# Patient Record
Sex: Female | Born: 1955 | Race: White | Hispanic: No | Marital: Married | Smoking: Former smoker
Health system: Southern US, Community
[De-identification: ages and names within clinical notes are randomized; demographics above are authoritative.]

## PROBLEM LIST (undated history)

## (undated) DIAGNOSIS — R7303 Prediabetes: Secondary | ICD-10-CM

## (undated) DIAGNOSIS — I10 Essential (primary) hypertension: Secondary | ICD-10-CM

## (undated) DIAGNOSIS — T7840XA Allergy, unspecified, initial encounter: Secondary | ICD-10-CM

## (undated) DIAGNOSIS — E669 Obesity, unspecified: Secondary | ICD-10-CM

## (undated) DIAGNOSIS — E785 Hyperlipidemia, unspecified: Secondary | ICD-10-CM

## (undated) DIAGNOSIS — F419 Anxiety disorder, unspecified: Secondary | ICD-10-CM

## (undated) DIAGNOSIS — E119 Type 2 diabetes mellitus without complications: Secondary | ICD-10-CM

## (undated) HISTORY — PX: EYE SURGERY: SHX253

## (undated) HISTORY — DX: Essential (primary) hypertension: I10

## (undated) HISTORY — DX: Hyperlipidemia, unspecified: E78.5

## (undated) HISTORY — DX: Allergy, unspecified, initial encounter: T78.40XA

## (undated) HISTORY — PX: TUBAL LIGATION: SHX77

## (undated) HISTORY — DX: Anxiety disorder, unspecified: F41.9

## (undated) HISTORY — DX: Type 2 diabetes mellitus without complications: E11.9

---
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
## 2015-08-16 LAB — HM HEPATITIS C SCREENING LAB: HM HEPATITIS C SCREENING: NEGATIVE

## 2016-11-16 LAB — HEMOGLOBIN A1C: HEMOGLOBIN A1C: 5.6

## 2016-11-17 LAB — VITAMIN D 25 HYDROXY (VIT D DEFICIENCY, FRACTURES): Vit D, 25-Hydroxy: 45.7

## 2016-11-17 LAB — TSH: TSH: 1.35 (ref 0.41–5.90)

## 2016-11-17 LAB — HEPATIC FUNCTION PANEL
ALT: 14 (ref 7–35)
AST: 15 (ref 13–35)
Alkaline Phosphatase: 37 (ref 25–125)
BILIRUBIN, TOTAL: 0.4

## 2016-11-17 LAB — BASIC METABOLIC PANEL
BUN: 11 (ref 4–21)
Creatinine: 0.7 (ref 0.5–1.1)
GLUCOSE: 95
POTASSIUM: 4 (ref 3.4–5.3)
SODIUM: 141 (ref 137–147)

## 2016-11-17 LAB — LIPID PANEL
Cholesterol: 156 (ref 0–200)
HDL: 45 (ref 35–70)
LDL Cholesterol: 80
Triglycerides: 155 (ref 40–160)

## 2016-11-17 LAB — CBC AND DIFFERENTIAL
HCT: 44 (ref 36–46)
HEMOGLOBIN: 15.1 (ref 12.0–16.0)
Neutrophils Absolute: 4
PLATELETS: 238 (ref 150–399)
WBC: 6.8

## 2016-11-18 LAB — HM PAP SMEAR: HM Pap smear: NEGATIVE

## 2017-05-10 ENCOUNTER — Encounter: Payer: Self-pay | Admitting: Family Medicine

## 2017-05-10 ENCOUNTER — Ambulatory Visit (INDEPENDENT_AMBULATORY_CARE_PROVIDER_SITE_OTHER): Payer: BC Managed Care – PPO | Admitting: Family Medicine

## 2017-05-10 VITALS — BP 114/68 | HR 88 | Temp 97.7°F | Resp 16 | Ht 64.0 in | Wt 184.0 lb

## 2017-05-10 DIAGNOSIS — N3281 Overactive bladder: Secondary | ICD-10-CM

## 2017-05-10 DIAGNOSIS — Z720 Tobacco use: Secondary | ICD-10-CM

## 2017-05-10 DIAGNOSIS — F419 Anxiety disorder, unspecified: Secondary | ICD-10-CM

## 2017-05-10 DIAGNOSIS — K5903 Drug induced constipation: Secondary | ICD-10-CM

## 2017-05-10 DIAGNOSIS — Z87891 Personal history of nicotine dependence: Secondary | ICD-10-CM | POA: Insufficient documentation

## 2017-05-10 DIAGNOSIS — R739 Hyperglycemia, unspecified: Secondary | ICD-10-CM | POA: Diagnosis not present

## 2017-05-10 DIAGNOSIS — E559 Vitamin D deficiency, unspecified: Secondary | ICD-10-CM

## 2017-05-10 DIAGNOSIS — E785 Hyperlipidemia, unspecified: Secondary | ICD-10-CM | POA: Insufficient documentation

## 2017-05-10 DIAGNOSIS — K5900 Constipation, unspecified: Secondary | ICD-10-CM | POA: Insufficient documentation

## 2017-05-10 DIAGNOSIS — Z7689 Persons encountering health services in other specified circumstances: Secondary | ICD-10-CM | POA: Diagnosis not present

## 2017-05-10 DIAGNOSIS — Z1231 Encounter for screening mammogram for malignant neoplasm of breast: Secondary | ICD-10-CM | POA: Diagnosis not present

## 2017-05-10 DIAGNOSIS — K649 Unspecified hemorrhoids: Secondary | ICD-10-CM

## 2017-05-10 DIAGNOSIS — I10 Essential (primary) hypertension: Secondary | ICD-10-CM

## 2017-05-10 DIAGNOSIS — R7303 Prediabetes: Secondary | ICD-10-CM | POA: Insufficient documentation

## 2017-05-10 DIAGNOSIS — E782 Mixed hyperlipidemia: Secondary | ICD-10-CM

## 2017-05-10 MED ORDER — ROSUVASTATIN CALCIUM 10 MG PO TABS: 10.0000 mg | ORAL_TABLET | Freq: Every day | ORAL | 5 refills | Status: DC

## 2017-05-10 MED ORDER — FESOTERODINE FUMARATE ER 8 MG PO TB24: 8.0000 mg | ORAL_TABLET | Freq: Every day | ORAL | 5 refills | Status: DC

## 2017-05-10 MED ORDER — FENOFIBRATE 145 MG PO TABS: 145.0000 mg | ORAL_TABLET | Freq: Every day | ORAL | 5 refills | Status: DC

## 2017-05-10 NOTE — Assessment & Plan Note (Signed)
Well controlled Can continue buspar, but unlikely to be helping much at such small dose Ok to use xanax so infrequently, will not increase dose or frequency in the future

## 2017-05-10 NOTE — Patient Instructions (Signed)
Hemorrhoids Hemorrhoids are swollen veins in and around the rectum or anus. There are two types of hemorrhoids:  Internal hemorrhoids. These occur in the veins that are just inside the rectum. They may poke through to the outside and become irritated and painful.  External hemorrhoids. These occur in the veins that are outside of the anus and can be felt as a painful swelling or hard lump near the anus.  Most hemorrhoids do not cause serious problems, and they can be managed with home treatments such as diet and lifestyle changes. If home treatments do not help your symptoms, procedures can be done to shrink or remove the hemorrhoids. What are the causes? This condition is caused by increased pressure in the anal area. This pressure may result from various things, including:  Constipation.  Straining to have a bowel movement.  Diarrhea.  Pregnancy.  Obesity.  Sitting for long periods of time.  Heavy lifting or other activity that causes you to strain.  Anal sex.  What are the signs or symptoms? Symptoms of this condition include:  Pain.  Anal itching or irritation.  Rectal bleeding.  Leakage of stool (feces).  Anal swelling.  One or more lumps around the anus.  How is this diagnosed? This condition can often be diagnosed through a visual exam. Other exams or tests may also be done, such as:  Examination of the rectal area with a gloved hand (digital rectal exam).  Examination of the anal canal using a small tube (anoscope).  A blood test, if you have lost a significant amount of blood.  A test to look inside the colon (sigmoidoscopy or colonoscopy).  How is this treated? This condition can usually be treated at home. However, various procedures may be done if dietary changes, lifestyle changes, and other home treatments do not help your symptoms. These procedures can help make the hemorrhoids smaller or remove them completely. Some of these procedures involve  surgery, and others do not. Common procedures include:  Rubber band ligation. Rubber bands are placed at the base of the hemorrhoids to cut off the blood supply to them.  Sclerotherapy. Medicine is injected into the hemorrhoids to shrink them.  Infrared coagulation. A type of light energy is used to get rid of the hemorrhoids.  Hemorrhoidectomy surgery. The hemorrhoids are surgically removed, and the veins that supply them are tied off.  Stapled hemorrhoidopexy surgery. A circular stapling device is used to remove the hemorrhoids and use staples to cut off the blood supply to them.  Follow these instructions at home: Eating and drinking  Eat foods that have a lot of fiber in them, such as whole grains, beans, nuts, fruits, and vegetables. Ask your health care provider about taking products that have added fiber (fiber supplements).  Drink enough fluid to keep your urine clear or pale yellow. Managing pain and swelling  Take warm sitz baths for 20 minutes, 3-4 times a day to ease pain and discomfort.  If directed, apply ice to the affected area. Using ice packs between sitz baths may be helpful. ? Put ice in a plastic bag. ? Place a towel between your skin and the bag. ? Leave the ice on for 20 minutes, 2-3 times a day. General instructions  Take over-the-counter and prescription medicines only as told by your health care provider.  Use medicated creams or suppositories as told.  Exercise regularly.  Go to the bathroom when you have the urge to have a bowel movement. Do not wait.    Avoid straining to have bowel movements.  Keep the anal area dry and clean. Use wet toilet paper or moist towelettes after a bowel movement.  Do not sit on the toilet for long periods of time. This increases blood pooling and pain. Contact a health care provider if:  You have increasing pain and swelling that are not controlled by treatment or medicine.  You have uncontrolled bleeding.  You  have difficulty having a bowel movement, or you are unable to have a bowel movement.  You have pain or inflammation outside the area of the hemorrhoids. This information is not intended to replace advice given to you by your health care provider. Make sure you discuss any questions you have with your health care provider. Document Released: 06/12/2000 Document Revised: 11/13/2015 Document Reviewed: 02/27/2015 Elsevier Interactive Patient Education  2017 Elsevier Inc.  

## 2017-05-10 NOTE — Assessment & Plan Note (Signed)
Well controlled Continue current medications Check CMP 

## 2017-05-10 NOTE — Assessment & Plan Note (Signed)
Did not assess today Discussed hydrocortisone cream prn Discussed management of constipation Offered GI referral for possible banding, patient declines at this time

## 2017-05-10 NOTE — Assessment & Plan Note (Signed)
UTD on lipid panel Continue fenofibrates and crestor

## 2017-05-10 NOTE — Assessment & Plan Note (Signed)
Recheck A1c Not on medications

## 2017-05-10 NOTE — Assessment & Plan Note (Signed)
Recheck Vit D 

## 2017-05-10 NOTE — Assessment & Plan Note (Signed)
Discussed management of intermittent constipaiton Trial of daily miralax Continue high fiber diet

## 2017-05-10 NOTE — Assessment & Plan Note (Signed)
Continue toviaz Well controlled
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
## 2017-05-10 NOTE — Assessment & Plan Note (Signed)
3-5 minute discussion regarding risks of smoking, benefits of quitting, ways to quit Patient to try natural remedies and cutting back Continue to assess at f/u visits

## 2017-05-11 ENCOUNTER — Telehealth: Payer: Self-pay

## 2017-05-11 ENCOUNTER — Encounter: Payer: Self-pay | Admitting: Family Medicine

## 2017-05-11 LAB — VITAMIN D 25 HYDROXY (VIT D DEFICIENCY, FRACTURES): VIT D 25 HYDROXY: 43 ng/mL (ref 30–100)

## 2017-05-11 LAB — COMPLETE METABOLIC PANEL WITH GFR
AG Ratio: 1.9 (calc) (ref 1.0–2.5)
ALT: 22 U/L (ref 6–29)
AST: 16 U/L (ref 10–35)
Albumin: 4.5 g/dL (ref 3.6–5.1)
Alkaline phosphatase (APISO): 44 U/L (ref 33–130)
BUN: 17 mg/dL (ref 7–25)
CALCIUM: 10.1 mg/dL (ref 8.6–10.4)
CO2: 31 mmol/L (ref 20–32)
CREATININE: 0.6 mg/dL (ref 0.50–0.99)
Chloride: 106 mmol/L (ref 98–110)
GFR, EST NON AFRICAN AMERICAN: 99 mL/min/{1.73_m2} (ref >=60)
GFR, Est African American: 115 mL/min/{1.73_m2} (ref >=60)
GLOBULIN: 2.4 g/dL (ref 1.9–3.7)
Glucose, Bld: 114 mg/dL (ref 65–139)
Potassium: 4.3 mmol/L (ref 3.5–5.3)
SODIUM: 142 mmol/L (ref 135–146)
Total Bilirubin: 0.3 mg/dL (ref 0.2–1.2)
Total Protein: 6.9 g/dL (ref 6.1–8.1)

## 2017-05-11 LAB — HEMOGLOBIN A1C
HEMOGLOBIN A1C: 5.5 %{Hb} (ref <5.7)
MEAN PLASMA GLUCOSE: 111 (calc)
eAG (mmol/L): 6.2 (calc)

## 2017-05-11 NOTE — Telephone Encounter (Signed)
-----   Message from Erasmo DownerAngela M Bacigalupo, MD sent at 05/11/2017  8:18 AM EST ----- Normal A1c, Vitamin D, kidney function, liver function, electrolytes.  Erasmo DownerBacigalupo, Angela M, MD, MPH [facility] 05/11/2017 8:18 AM

## 2017-05-11 NOTE — Telephone Encounter (Signed)
Left message advising pt. OK per DPR. 

## 2017-05-18 ENCOUNTER — Ambulatory Visit
Admission: RE | Admit: 2017-05-18 | Discharge: 2017-05-18 | Disposition: A | Discharge status: Home / Self Care | Payer: BC Managed Care – PPO | Source: Ambulatory Visit | Attending: Family Medicine | Admitting: Family Medicine

## 2017-05-18 DIAGNOSIS — Z1231 Encounter for screening mammogram for malignant neoplasm of breast: Secondary | ICD-10-CM | POA: Diagnosis present

## 2017-05-24 ENCOUNTER — Inpatient Hospital Stay
Admission: RE | Admit: 2017-05-24 | Discharge: 2017-05-24 | Disposition: A | Discharge status: Home / Self Care | Payer: Self-pay | Source: Ambulatory Visit | Attending: *Deleted | Admitting: *Deleted

## 2017-05-24 ENCOUNTER — Telehealth: Payer: Self-pay

## 2017-05-24 ENCOUNTER — Other Ambulatory Visit: Payer: Self-pay | Admitting: *Deleted

## 2017-05-24 DIAGNOSIS — Z9289 Personal history of other medical treatment: Secondary | ICD-10-CM

## 2017-05-24 NOTE — Telephone Encounter (Signed)
Pt advised and saw the results on My Chart.

## 2017-05-24 NOTE — Telephone Encounter (Signed)
-----   Message from Erasmo DownerAngela M Bacigalupo, MD sent at 05/24/2017  3:25 PM EST ----- Normal mammogram.    Erasmo DownerBacigalupo, Angela M, MD, MPH [facility] 05/24/2017 3:25 PM

## 2017-07-07 ENCOUNTER — Other Ambulatory Visit: Payer: Self-pay | Admitting: Family Medicine

## 2017-07-07 ENCOUNTER — Telehealth: Payer: Self-pay | Admitting: Family Medicine

## 2017-07-07 DIAGNOSIS — Z111 Encounter for screening for respiratory tuberculosis: Secondary | ICD-10-CM

## 2017-07-07 NOTE — Telephone Encounter (Signed)
OK to order quantiferon gold for TB screening  Bonnie Duffy, Bonnie SchleinAngela M, MD, MPH [facility] 07/07/2017 2:30 PM

## 2017-07-07 NOTE — Telephone Encounter (Signed)
Patient states that she is starting a new job and is needing a form filled out and it requires her to have a TB screen.  I advised her to bring the form and that we only do blood work for to screen for TB.  She states that she does not think she needs a OV for this since she was just seen in November.  Please let patient know when lab slip is ready.

## 2017-07-07 NOTE — Telephone Encounter (Signed)
Ok to order lab

## 2017-07-07 NOTE — Telephone Encounter (Signed)
Lab slip printed at front desk for pick up. Patient advised.  

## 2017-07-11 LAB — QUANTIFERON-TB GOLD PLUS
QUANTIFERON NIL VALUE: 0.03 [IU]/mL
QUANTIFERON TB2 AG VALUE: 0.03 [IU]/mL
QUANTIFERON-TB GOLD PLUS: NEGATIVE
QuantiFERON Mitogen Value: >10 IU/mL
QuantiFERON TB1 Ag Value: 0.02 IU/mL

## 2017-07-12 ENCOUNTER — Telehealth: Payer: Self-pay

## 2017-07-12 DIAGNOSIS — Z0184 Encounter for antibody response examination: Secondary | ICD-10-CM

## 2017-07-12 NOTE — Telephone Encounter (Signed)
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 Forms asking for immunizations to UTD. Per our records and [facility], Hep B and Tdap UTD, but no records of MMR. Pt agrees to have titers drawn. Labs ordered and pt advised.

## 2017-07-13 ENCOUNTER — Telehealth: Payer: Self-pay

## 2017-07-13 LAB — MEASLES/MUMPS/RUBELLA IMMUNITY
MUMPS ABS, IGG: 222 [AU]/ml (ref >10.9)
RUBEOLA AB, IGG: 220 AU/mL (ref >29.9)
Rubella Antibodies, IGG: 1.56 index (ref >0.99)

## 2017-07-13 NOTE — Telephone Encounter (Signed)
-----  Message from Virginia Crews, MD sent at 07/13/2017  8:39 AM EST ----- Patient is MMR immune.  Form completed. Patient can pick it up.  Virginia Crews, MD, MPH [facility] 07/13/2017 8:39 AM

## 2017-07-13 NOTE — Telephone Encounter (Signed)
Pt advised and form placed up front for pick up.

## 2017-11-18 ENCOUNTER — Encounter: Payer: Self-pay | Admitting: Family Medicine

## 2017-11-18 ENCOUNTER — Ambulatory Visit (INDEPENDENT_AMBULATORY_CARE_PROVIDER_SITE_OTHER): Payer: BC Managed Care – PPO | Admitting: Family Medicine

## 2017-11-18 VITALS — BP 130/78 | HR 76 | Temp 97.7°F | Resp 16 | Ht 64.0 in | Wt 195.0 lb

## 2017-11-18 DIAGNOSIS — E782 Mixed hyperlipidemia: Secondary | ICD-10-CM

## 2017-11-18 DIAGNOSIS — Z0000 Encounter for general adult medical examination without abnormal findings: Secondary | ICD-10-CM | POA: Diagnosis not present

## 2017-11-18 DIAGNOSIS — R739 Hyperglycemia, unspecified: Secondary | ICD-10-CM

## 2017-11-18 DIAGNOSIS — I10 Essential (primary) hypertension: Secondary | ICD-10-CM

## 2017-11-18 DIAGNOSIS — R590 Localized enlarged lymph nodes: Secondary | ICD-10-CM

## 2017-11-18 DIAGNOSIS — J0100 Acute maxillary sinusitis, unspecified: Secondary | ICD-10-CM | POA: Diagnosis not present

## 2017-11-18 DIAGNOSIS — Z720 Tobacco use: Secondary | ICD-10-CM

## 2017-11-18 DIAGNOSIS — Z1211 Encounter for screening for malignant neoplasm of colon: Secondary | ICD-10-CM | POA: Diagnosis not present

## 2017-11-18 MED ORDER — NICOTINE POLACRILEX 2 MG MT GUM: 2.0000 mg | CHEWING_GUM | OROMUCOSAL | 5 refills | Status: DC | PRN

## 2017-11-18 MED ORDER — DOXYCYCLINE HYCLATE 100 MG PO TABS: 100.0000 mg | ORAL_TABLET | Freq: Two times a day (BID) | ORAL | 0 refills | Status: DC

## 2017-11-18 MED ORDER — FLUTICASONE PROPIONATE 50 MCG/ACT NA SUSP: 2.0000 | Freq: Every day | NASAL | 11 refills | Status: DC

## 2017-11-18 NOTE — Assessment & Plan Note (Signed)
3 to 5-minute discussion regarding the risks of smoking, including increased cancer risk, heart disease risk, lung disease risk Also discussed the benefits of quitting ways of quitting Patient has been unsuccessful with quitting cold [place] She has been trying a Nicotrol inhaler, but found this is difficult as she smokes menthol cigarettes increase in segmented taste She has quit in the past cold [place] when she was pregnant  She was unable to tolerate Chantix due to nightmares She would like to try nicotine gum today Prescription sent to the pharmacy Discussed importance of chewing in the parking in the cheek for absorption of nicotine

## 2017-11-18 NOTE — Assessment & Plan Note (Signed)
Recheck A1c Not on medications 

## 2017-11-18 NOTE — Assessment & Plan Note (Signed)
Well controlled Continue current meds Check CMP F/u in 6 months 

## 2017-11-18 NOTE — Patient Instructions (Addendum)
The CDC recommends two doses of Shingrix (the shingles vaccine) separated by 2 to 6 months for adults age 62 years and older. I recommend checking with your insurance plan regarding coverage for this vaccine.    Preventive Care 40-64 Years, Female Preventive care refers to lifestyle choices and visits with your health care provider that can promote health and wellness. What does preventive care include?  A yearly physical exam. This is also called an annual well check.  Dental exams once or twice a year.  Routine eye exams. Ask your health care provider how often you should have your eyes checked.  Personal lifestyle choices, including: ? Daily care of your teeth and gums. ? Regular physical activity. ? Eating a healthy diet. ? Avoiding tobacco and drug use. ? Limiting alcohol use. ? Practicing safe sex. ? Taking low-dose aspirin daily starting at age 42. ? Taking vitamin and mineral supplements as recommended by your health care provider. What happens during an annual well check? The services and screenings done by your health care provider during your annual well check will depend on your age, overall health, lifestyle risk factors, and family history of disease. Counseling Your health care provider may ask you questions about your:  Alcohol use.  Tobacco use.  Drug use.  Emotional well-being.  Home and relationship well-being.  Sexual activity.  Eating habits.  Work and work Statistician.  Method of birth control.  Menstrual cycle.  Pregnancy history.  Screening You may have the following tests or measurements:  Height, weight, and BMI.  Blood pressure.  Lipid and cholesterol levels. These may be checked every 5 years, or more frequently if you are over 53 years old.  Skin check.  Lung cancer screening. You may have this screening every year starting at age 3 if you have a 30-pack-year history of smoking and currently smoke or have quit within the past  15 years.  Fecal occult blood test (FOBT) of the stool. You may have this test every year starting at age 24.  Flexible sigmoidoscopy or colonoscopy. You may have a sigmoidoscopy every 5 years or a colonoscopy every 10 years starting at age 76.  Hepatitis C blood test.  Hepatitis B blood test.  Sexually transmitted disease (STD) testing.  Diabetes screening. This is done by checking your blood sugar (glucose) after you have not eaten for a while (fasting). You may have this done every 1-3 years.  Mammogram. This may be done every 1-2 years. Talk to your health care provider about when you should start having regular mammograms. This may depend on whether you have a family history of breast cancer.  BRCA-related cancer screening. This may be done if you have a family history of breast, ovarian, tubal, or peritoneal cancers.  Pelvic exam and Pap test. This may be done every 3 years starting at age 16. Starting at age 9, this may be done every 5 years if you have a Pap test in combination with an HPV test.  Bone density scan. This is done to screen for osteoporosis. You may have this scan if you are at high risk for osteoporosis.  Discuss your test results, treatment options, and if necessary, the need for more tests with your health care provider. Vaccines Your health care provider may recommend certain vaccines, such as:  Influenza vaccine. This is recommended every year.  Tetanus, diphtheria, and acellular pertussis (Tdap, Td) vaccine. You may need a Td booster every 10 years.  Varicella vaccine. You may  need this if you have not been vaccinated.  Zoster vaccine. You may need this after age 3.  Measles, mumps, and rubella (MMR) vaccine. You may need at least one dose of MMR if you were born in 1957 or later. You may also need a second dose.  Pneumococcal 13-valent conjugate (PCV13) vaccine. You may need this if you have certain conditions and were not previously  vaccinated.  Pneumococcal polysaccharide (PPSV23) vaccine. You may need one or two doses if you smoke cigarettes or if you have certain conditions.  Meningococcal vaccine. You may need this if you have certain conditions.  Hepatitis A vaccine. You may need this if you have certain conditions or if you travel or work in places where you may be exposed to hepatitis A.  Hepatitis B vaccine. You may need this if you have certain conditions or if you travel or work in places where you may be exposed to hepatitis B.  Haemophilus influenzae type b (Hib) vaccine. You may need this if you have certain conditions.  Talk to your health care provider about which screenings and vaccines you need and how often you need them. This information is not intended to replace advice given to you by your health care provider. Make sure you discuss any questions you have with your health care provider. Document Released: 07/12/2015 Document Revised: 03/04/2016 Document Reviewed: 04/16/2015 Elsevier Interactive Patient Education  [employer].

## 2017-11-18 NOTE — Assessment & Plan Note (Signed)
Recheck fasting lipid panel today Continue fenofibrate and Crestor

## 2017-11-18 NOTE — Progress Notes (Signed)
Patient: Bonnie Duffy, Female    DOB: 1956/05/04, 62 y.o.   MRN: [id number] Visit Date: 11/18/2017  Today's Provider: Shirlee Latch, MD   I, Joslyn Hy, CMA, am acting as scribe for Shirlee Latch, MD.  Chief Complaint  Patient presents with  . Annual Exam   Subjective:    Annual physical exam Bonnie Duffy is a 62 y.o. female who presents today for health maintenance and complete physical. She feels fairly well.   She is c/o left ear pain, dizziness, sinus swelling, sinus tenderness, jaw/tooth pain that has been occurring for "a couple days". Pt states sx occurred after a "tongue biopsy" that was performed 1 week ago. The biopsy showed lichen planus.  Oral surgeon told her this was not related.   She reports exercising 5 days per week for 45-60 minutes. Exercise classes and cardio. She reports she is sleeping fairly well.  Last colonoscopy- 11/26/2006- Care Everywhere. Normal. Pt is requesting a Cologuard. There is no family H/O colon cancer, and no previous polyps. Last pap- 11/18/2016- NIL; HPV negative. Pt states she has never had an abnormal pap. Last mammogram- 05/18/2017- BI-RADS 1 -----------------------------------------------------------------   Review of Systems  Constitutional: Negative.   HENT: Positive for ear pain, facial swelling, sinus pressure and tinnitus. Negative for congestion, dental problem, drooling, ear discharge, hearing loss, mouth sores, nosebleeds, postnasal drip, rhinorrhea, sinus pain, sneezing, sore throat, trouble swallowing and voice change.   Eyes: Negative.   Respiratory: Negative.   Cardiovascular: Negative.   Gastrointestinal: Negative.   Endocrine: Negative.   Genitourinary: Positive for enuresis. Negative for decreased urine volume, difficulty urinating, dyspareunia, dysuria, flank pain, frequency, genital sores, hematuria, menstrual problem, pelvic pain, urgency, vaginal bleeding, vaginal discharge and vaginal pain.    Musculoskeletal: Negative.   Skin: Negative.   Allergic/Immunologic: Positive for environmental allergies. Negative for food allergies and immunocompromised state.  Neurological: Positive for dizziness. Negative for tremors, seizures, syncope, facial asymmetry, speech difficulty, weakness, light-headedness, numbness and headaches.  Hematological: Negative.   Psychiatric/Behavioral: Negative.     Social History      She  reports that she has been smoking cigarettes.  She has a 46.00 pack-year smoking history. She has never used smokeless tobacco. She reports that she drinks alcohol. She reports that she does not use drugs.       Social History   Socioeconomic History  . Marital status: Divorced    Spouse name: Not on file  . Number of children: 2  . Years of education: Not on file  . Highest education level: Master's degree (e.g., MA, MS, MEng, MEd, MSW, MBA)  Occupational History    Employer: [employer]    Comment: retired  Engineer, production  . Financial resource strain: Not hard at all  . Food insecurity:    Worry: Never true    Inability: Never true  . Transportation needs:    Medical: No    Non-medical: No  Tobacco Use  . Smoking status: Current Every Day Smoker    Packs/day: 1.00    Years: 46.00    Pack years: 46.00    Types: Cigarettes  . Smokeless tobacco: Never Used  . Tobacco comment: started smoking at age 7; has quit smoking for more than 10 years altogther in the past  Substance and Sexual Activity  . Alcohol use: Yes    Comment: one drink per month; wine  . Drug use: No  . Sexual activity: Not Currently  Lifestyle  . Physical  activity:    Days per week: 5 days    Minutes per session: 50 min  . Stress: Not at all  Relationships  . Social connections:    Talks on phone: Not on file    Gets together: Not on file    Attends religious service: Not on file    Active member of club or organization: Not on file    Attends meetings of clubs or organizations: Not on  file    Relationship status: Not on file  Other Topics Concern  . Not on file  Social History Narrative  . Not on file    Past Medical History:  Diagnosis Date  . Allergy   . Anxiety   . Hyperlipidemia   . Hypertension   . T2DM (type 2 diabetes mellitus) [facility])      Patient Active Problem List   Diagnosis Date Noted  . Constipation 05/10/2017  . Hemorrhoids 05/10/2017  . Overactive bladder 05/10/2017  . Anxiety 05/10/2017  . Hyperlipidemia 05/10/2017  . Blood glucose elevated 05/10/2017  . Tobacco abuse 05/10/2017  . Avitaminosis D 05/10/2017  . Essential hypertension 05/10/2017    Past Surgical History:  Procedure Laterality Date  . EYE SURGERY    . TONSILLECTOMY  1960  . TUBAL LIGATION      Family History        Family Status  Relation Name Status  . Mother  Deceased at age 40  . Father  Deceased  . Sister  Alive  . Brother  Alive  . MGM  (Not Specified)  . MGF  (Not Specified)  . PGM  (Not Specified)  . PGF  (Not Specified)  . Neg Hx  (Not Specified)        Her family history includes Breast cancer (age of onset: 37) in her maternal grandmother; Diabetes in her maternal grandmother; Healthy in her brother; Heart attack in her father; Heart disease in her father, maternal grandfather, maternal grandmother, mother, and paternal grandmother; Leukemia in her paternal grandfather; Lung cancer in her father; Prostate cancer in her father; Skin cancer in her father; Stroke (age of onset: 52) in her sister. There is no history of Colon cancer, Ovarian cancer, or Cervical cancer.      Allergies  Allergen Reactions  . Penicillins     No reactions noted.   . Sulfamethoxazole Rash     Current Outpatient Medications:  .  ALPRAZolam (XANAX) 0.25 MG tablet, 1/2 TABLET 2 TO 3 TIMES daily  AS NEEDED, Disp: , Rfl:  .  busPIRone (BUSPAR) 15 MG tablet, Take 0.5 tablets daily by mouth., Disp: , Rfl:  .  fenofibrate (TRICOR) 145 MG tablet, Take 1 tablet (145 mg total)  daily by mouth., Disp: 30 tablet, Rfl: 5 .  fesoterodine (TOVIAZ) 8 MG TB24 tablet, Take 1 tablet (8 mg total) daily by mouth., Disp: 30 tablet, Rfl: 5 .  fexofenadine (ALLEGRA) 180 MG tablet, Take by mouth., Disp: , Rfl:  .  fluticasone (FLONASE) 50 MCG/ACT nasal spray, Place 2 sprays into both nostrils daily., Disp: 16 g, Rfl: 11 .  lisinopril-hydrochlorothiazide (PRINZIDE,ZESTORETIC) 20-25 MG tablet, TAKE ONE half TABLET BY MOUTH EVERY MORNING, Disp: , Rfl:  .  metoprolol succinate (TOPROL-XL) 50 MG 24 hr tablet, TAKE 1/2 TABLET BY MOUTH EVERY DAY, Disp: , Rfl:  .  Multiple Vitamins-Minerals (MULTIVITAMIN PO), Take by mouth., Disp: , Rfl:  .  Omega-3 Fatty Acids (FISH OIL) 1200 MG CAPS, Take by mouth., Disp: , Rfl:  .  rosuvastatin (CRESTOR) 10 MG tablet, Take 1 tablet (10 mg total) daily by mouth., Disp: 30 tablet, Rfl: 5 .  vitamin C (ASCORBIC ACID) 500 MG tablet, Take 500 mg daily by mouth., Disp: , Rfl:  .  Vitamin D, Ergocalciferol, 2000 units CAPS, Take by mouth., Disp: , Rfl:  .  doxycycline (VIBRA-TABS) 100 MG tablet, Take 1 tablet (100 mg total) by mouth 2 (two) times daily., Disp: 20 tablet, Rfl: 0 .  nicotine polacrilex (NICORETTE) 2 MG gum, Take 1 each (2 mg total) by mouth as needed for smoking cessation., Disp: 100 tablet, Rfl: 5   Patient Care Team: Erasmo Downer, MD as PCP - General (Family Medicine)      Objective:   Vitals: BP 130/78 (BP Location: Left Arm, Patient Position: Sitting, Cuff Size: Large)   Pulse 76   Temp 97.7 F (36.5 C) (Oral)   Resp 16   Ht  (1.626 m)   Wt 195 lb (88.5 kg)   SpO2 95%   BMI 33.47 kg/m    Vitals:   11/18/17 0859  BP: 130/78  Pulse: 76  Resp: 16  Temp: 97.7 F (36.5 C)  TempSrc: Oral  SpO2: 95%  Weight: 195 lb (88.5 kg)  Height:  (1.626 m)     Physical Exam  Constitutional: She is oriented to person, place, and time. She appears well-developed and well-nourished. No distress.  HENT:  Head:  Normocephalic and atraumatic.  Right Ear: Tympanic membrane, external ear and ear canal normal.  Left Ear: Tympanic membrane, external ear and ear canal normal.  Nose: Mucosal edema present. Right sinus exhibits no maxillary sinus tenderness and no frontal sinus tenderness. Left sinus exhibits maxillary sinus tenderness and frontal sinus tenderness.  Mouth/Throat: Oropharynx is clear and moist and mucous membranes are normal. No trismus in the jaw. No dental abscesses. No oropharyngeal exudate, posterior oropharyngeal edema or posterior oropharyngeal erythema.  Eyes: Pupils are equal, round, and reactive to light. Conjunctivae and EOM are normal. No scleral icterus.  Neck: Neck supple. No thyromegaly present.  Cardiovascular: Normal rate, regular rhythm, normal heart sounds and intact distal pulses.  No murmur heard. Pulmonary/Chest: Breath sounds normal. No respiratory distress. She has no wheezes. She has no rales.  Abdominal: Soft. Bowel sounds are normal. She exhibits no distension. There is no tenderness. There is no rebound and no guarding.  Musculoskeletal: She exhibits no edema or deformity.  Lymphadenopathy:       Head (left side): Preauricular adenopathy present.    She has cervical adenopathy.       Right cervical: Superficial cervical (chronic, s/p benign biopsy) adenopathy present. No deep cervical and no posterior cervical adenopathy present.      Left cervical: No superficial cervical, no deep cervical and no posterior cervical adenopathy present.  Neurological: She is alert and oriented to person, place, and time.  Skin: Skin is warm and dry. Capillary refill takes less than 2 seconds. No rash noted.  Psychiatric: She has a normal mood and affect. Her behavior is normal.  Vitals reviewed.    Depression Screen PHQ 2/9 Scores 05/10/2017  PHQ - 2 Score 0     Assessment & Plan:     Routine Health Maintenance and Physical Exam  Exercise Activities and Dietary  recommendations Goals    None      Immunization History  Administered Date(s) Administered  . Hepatitis B 10/03/1992, 11/04/1992, 04/24/1993  . Influenza,inj,Quad PF,6+ Mos 03/29/2017  . Pneumococcal Polysaccharide-23 03/13/2012  .  Tdap 10/17/2008    Health Maintenance  Topic Date Due  . HIV Screening  06/13/1971  . COLONOSCOPY  06/12/2006  . INFLUENZA VACCINE  01/27/2018  . TETANUS/TDAP  10/18/2018  . MAMMOGRAM  05/19/2019  . PAP SMEAR  11/19/2019  . Hepatitis C Screening  Completed     Discussed health benefits of physical activity, and encouraged her to engage in regular exercise appropriate for her age and condition.    -------------------------------------------------------------------- Problem List Items Addressed This Visit      Cardiovascular and Mediastinum   Essential hypertension    Well controlled Continue current meds Check CMP F/u in 6 months      Relevant Orders   Comprehensive metabolic panel     Other   Hyperlipidemia    Recheck fasting lipid panel today Continue fenofibrate and Crestor      Relevant Orders   Lipid panel   Comprehensive metabolic panel   Blood glucose elevated    Recheck A1c Not on medications      Relevant Orders   Hemoglobin A1c   Tobacco abuse    3 to 5-minute discussion regarding the risks of smoking, including increased cancer risk, heart disease risk, lung disease risk Also discussed the benefits of quitting ways of quitting Patient has been unsuccessful with quitting cold [place] She has been trying a Nicotrol inhaler, but found this is difficult as she smokes menthol cigarettes increase in segmented taste She has quit in the past cold [place] when she was pregnant  She was unable to tolerate Chantix due to nightmares She would like to try nicotine gum today Prescription sent to the pharmacy Discussed importance of chewing in the parking in the cheek for absorption of nicotine       Other Visit Diagnoses     Encounter for annual physical exam    -  Primary   Colon cancer screening       Relevant Orders   Cologuard   Preauricular lymphadenopathy       Acute non-recurrent maxillary sinusitis       Relevant Medications   doxycycline (VIBRA-TABS) 100 MG tablet   fluticasone (FLONASE) 50 MCG/ACT nasal spray    - patient with L sided facial pain, swelling, sinus congestion, and eustachian tube dysfunction, as well as 1 enlarged preauricular LN - treat for sinusitis - monitor for improvement of LAD - return precautions discussed   Return in about 6 months (around 05/21/2018) for chronic disease f/u.   The entirety of the information documented in the History of Present Illness, Review of Systems and Physical Exam were personally obtained by me. Portions of this information were initially documented by Irving Burton Ratchford, CMA and reviewed by me for thoroughness and accuracy.    Erasmo Downer, MD, MPH [facility] 11/18/2017 10:11 AM

## 2017-11-19 ENCOUNTER — Telehealth: Payer: Self-pay

## 2017-11-19 LAB — LIPID PANEL
CHOLESTEROL TOTAL: 160 mg/dL (ref 100–199)
Chol/HDL Ratio: 3.5 ratio (ref 0.0–4.4)
HDL: 46 mg/dL (ref >39)
LDL CALC: 76 mg/dL (ref 0–99)
Triglycerides: 189 mg/dL — ABNORMAL HIGH (ref 0–149)
VLDL Cholesterol Cal: 38 mg/dL (ref 5–40)

## 2017-11-19 LAB — COMPREHENSIVE METABOLIC PANEL
ALBUMIN: 4.5 g/dL (ref 3.6–4.8)
ALT: 22 IU/L (ref 0–32)
AST: 19 IU/L (ref 0–40)
Albumin/Globulin Ratio: 2 (ref 1.2–2.2)
Alkaline Phosphatase: 47 IU/L (ref 39–117)
BUN/Creatinine Ratio: 19 (ref 12–28)
BUN: 14 mg/dL (ref 8–27)
Bilirubin Total: 0.4 mg/dL (ref 0.0–1.2)
CALCIUM: 10.2 mg/dL (ref 8.7–10.3)
CO2: 23 mmol/L (ref 20–29)
Chloride: 106 mmol/L (ref 96–106)
Creatinine, Ser: 0.72 mg/dL (ref 0.57–1.00)
GFR calc Af Amer: 105 mL/min/{1.73_m2} (ref >59)
GFR calc non Af Amer: 91 mL/min/{1.73_m2} (ref >59)
GLOBULIN, TOTAL: 2.3 g/dL (ref 1.5–4.5)
Glucose: 91 mg/dL (ref 65–99)
POTASSIUM: 4.4 mmol/L (ref 3.5–5.2)
SODIUM: 143 mmol/L (ref 134–144)
TOTAL PROTEIN: 6.8 g/dL (ref 6.0–8.5)

## 2017-11-19 LAB — HEMOGLOBIN A1C
ESTIMATED AVERAGE GLUCOSE: 117 mg/dL
HEMOGLOBIN A1C: 5.7 % — AB (ref 4.8–5.6)

## 2017-11-19 NOTE — Telephone Encounter (Signed)
-----   Message from Erasmo Downer, MD sent at 11/19/2017  8:51 AM EDT ----- Good cholesterol.  Normal kidney function, liver function, electrolytes.  Hemoglobin A1c (A 3 month average of blood sugars) is now in the pre-diabetic range at 5.7.  Triglycerides are also elevated which can be related to high blood sugar.  Recommend low carb diet and exercise at least 5 times weekly for at least 30 min/day.  Erasmo Downer, MD, MPH [facility] 11/19/2017 8:51 AM

## 2017-11-19 NOTE — Telephone Encounter (Signed)
Pt advised of results. States her A1C is usually in prediabetic range, and feels comfortable in not making any changes at this time.

## 2017-11-25 ENCOUNTER — Telehealth: Payer: Self-pay | Admitting: Family Medicine

## 2017-11-25 NOTE — Telephone Encounter (Signed)
Order for cologuard faxed to [facility] °

## 2017-12-05 ENCOUNTER — Encounter: Payer: Self-pay | Admitting: Family Medicine

## 2017-12-05 LAB — COLOGUARD

## 2017-12-06 ENCOUNTER — Telehealth: Payer: Self-pay | Admitting: Family Medicine

## 2017-12-06 NOTE — Telephone Encounter (Signed)
Patient brought in a Medical Clear[facility] by [facility] provider. Form was placed in providers box. Thanks CC

## 2017-12-07 NOTE — Telephone Encounter (Signed)
Fax, mailed and pt advised.

## 2017-12-20 LAB — HM COLONOSCOPY

## 2017-12-25 ENCOUNTER — Encounter: Payer: Self-pay | Admitting: Family Medicine

## 2017-12-27 ENCOUNTER — Encounter: Payer: Self-pay | Admitting: Family Medicine

## 2017-12-27 NOTE — Telephone Encounter (Signed)
Pt advised.   Thanks,   -Emmanuella Mirante  

## 2017-12-27 NOTE — Telephone Encounter (Signed)
-----   Message from Erasmo DownerAngela M Bacigalupo, MD sent at 12/20/2017 10:42 AM EDT ----- Please let patient know it is negative.  Repeat in 3 years.  Will update HM.  ----- Message ----- From: Paschal DoppWalsh, Laura E, CMA Sent: 12/20/2017  10:41 AM To: Erasmo DownerAngela M Bacigalupo, MD    ----- Message ----- From: Remi Haggardouch, Caroline D Sent: 12/16/2017   2:26 PM To: Bacigalupo Nurse  Review incoming fax

## 2018-01-13 ENCOUNTER — Other Ambulatory Visit: Payer: Self-pay | Admitting: Family Medicine

## 2018-01-28 ENCOUNTER — Other Ambulatory Visit: Payer: Self-pay | Admitting: Family Medicine

## 2018-02-11 ENCOUNTER — Other Ambulatory Visit: Payer: Self-pay | Admitting: Family Medicine

## 2018-02-23 ENCOUNTER — Encounter: Payer: Self-pay | Admitting: Family Medicine

## 2018-02-24 MED ORDER — LISINOPRIL-HYDROCHLOROTHIAZIDE 20-25 MG PO TABS: ORAL_TABLET | ORAL | 3 refills | Status: DC

## 2018-03-17 ENCOUNTER — Ambulatory Visit: Payer: BC Managed Care – PPO | Admitting: Family Medicine

## 2018-03-17 ENCOUNTER — Encounter: Payer: Self-pay | Admitting: Family Medicine

## 2018-03-17 VITALS — BP 124/74 | HR 88 | Temp 98.4°F | Wt 193.8 lb

## 2018-03-17 DIAGNOSIS — N3091 Cystitis, unspecified with hematuria: Secondary | ICD-10-CM

## 2018-03-17 LAB — POCT URINALYSIS DIPSTICK
Bilirubin, UA: NEGATIVE
Glucose, UA: NEGATIVE
Ketones, UA: NEGATIVE
NITRITE UA: NEGATIVE
PH UA: 7 (ref 5.0–8.0)
PROTEIN UA: POSITIVE — AB
Spec Grav, UA: 1.01 (ref 1.010–1.025)
UROBILINOGEN UA: 0.2 U/dL

## 2018-03-17 MED ORDER — CEPHALEXIN 500 MG PO CAPS: 500.0000 mg | ORAL_CAPSULE | Freq: Two times a day (BID) | ORAL | 0 refills | Status: AC

## 2018-03-17 NOTE — Patient Instructions (Signed)

## 2018-03-17 NOTE — Progress Notes (Signed)
Patient: Bonnie Duffy Female    DOB: [birth year]   62 y.o.   MRN: [id number] Visit Date: 03/17/2018  Today's Provider: Shirlee LatchAngela Fabian Coca, MD   Chief Complaint  Patient presents with  . Dysuria   Subjective:    I, Presley RaddleNikki Walston, CMA, am acting as a scribe for Shirlee LatchAngela Malee Grays, MD.   Dysuria   This is a new problem. Episode onset: 2 days ago. The problem occurs every urination. The problem has been unchanged. The quality of the pain is described as burning. There has been no fever. Associated symptoms include frequency. Pertinent negatives include no chills, discharge, flank pain, hematuria, hesitancy, nausea, possible pregnancy, sweats, urgency or vomiting. Associated symptoms comments: Mild abdominal and back pain . She has tried increased fluids for the symptoms. The treatment provided no relief.   States she hasn't had a UTI in a long time.  She is now sexually active and believes that contributed.    Allergies  Allergen Reactions  . Penicillins     No reactions noted.   . Sulfamethoxazole Rash     Current Outpatient Medications:  .  ALPRAZolam (XANAX) 0.25 MG tablet, 1/2 TABLET 2 TO 3 TIMES daily  AS NEEDED, Disp: , Rfl:  .  fenofibrate (TRICOR) 145 MG tablet, TAKE 1 TABLET (145 MG TOTAL) DAILY BY MOUTH., Disp: 30 tablet, Rfl: 5 .  fexofenadine (ALLEGRA) 180 MG tablet, Take by mouth., Disp: , Rfl:  .  fluticasone (FLONASE) 50 MCG/ACT nasal spray, Place 2 sprays into both nostrils daily., Disp: 16 g, Rfl: 11 .  lisinopril-hydrochlorothiazide (PRINZIDE,ZESTORETIC) 20-25 MG tablet, TAKE ONE half TABLET BY MOUTH EVERY MORNING, Disp: 45 tablet, Rfl: 3 .  metoprolol succinate (TOPROL-XL) 50 MG 24 hr tablet, TAKE 1/2 TABLET BY MOUTH EVERY DAY, Disp: , Rfl:  .  Multiple Vitamins-Minerals (MULTIVITAMIN PO), Take by mouth., Disp: , Rfl:  .  Omega-3 Fatty Acids (FISH OIL) 1200 MG CAPS, Take by mouth., Disp: , Rfl:  .  rosuvastatin (CRESTOR) 10 MG tablet, TAKE 1 TABLET (10 MG  TOTAL) DAILY BY MOUTH., Disp: 90 tablet, Rfl: 1 .  TOVIAZ 8 MG TB24 tablet, TAKE 1 TABLET (8 MG TOTAL) DAILY BY MOUTH., Disp: 30 tablet, Rfl: 5 .  vitamin C (ASCORBIC ACID) 500 MG tablet, Take 500 mg daily by mouth., Disp: , Rfl:  .  Vitamin D, Ergocalciferol, 2000 units CAPS, Take by mouth., Disp: , Rfl:  .  cephALEXin (KEFLEX) 500 MG capsule, Take 1 capsule (500 mg total) by mouth 2 (two) times daily for 5 days., Disp: 10 capsule, Rfl: 0  Review of Systems  Constitutional: Negative for chills.  Gastrointestinal: Negative for nausea and vomiting.  Genitourinary: Positive for dysuria and frequency. Negative for flank pain, hematuria, hesitancy and urgency.    Social History   Tobacco Use  . Smoking status: Former Smoker    Packs/day: 1.00    Years: 46.00    Pack years: 46.00    Types: Cigarettes    Last attempt to quit: 03/03/2018    Years since quitting: 0.0  . Smokeless tobacco: Never Used  . Tobacco comment: started smoking at age 62; has quit smoking for more than 10 years altogther in the past  Substance Use Topics  . Alcohol use: Yes    Comment: one drink per month; wine   Objective:   BP 124/74 (BP Location: Right Arm, Patient Position: Sitting, Cuff Size: Large)   Pulse 88   Temp 98.4 F (36.9  C) (Oral)   Wt 193 lb 12.8 oz (87.9 kg)   SpO2 96%   BMI 33.27 kg/m  Vitals:   03/17/18 0913  BP: 124/74  Pulse: 88  Temp: 98.4 F (36.9 C)  TempSrc: Oral  SpO2: 96%  Weight: 193 lb 12.8 oz (87.9 kg)     Physical Exam  Constitutional: She is oriented to person, place, and time. She appears well-developed and well-nourished. No distress.  HENT:  Head: Normocephalic and atraumatic.  Eyes: Conjunctivae are normal. No scleral icterus.  Cardiovascular: Normal rate, regular rhythm, normal heart sounds and intact distal pulses.  No murmur heard. Pulmonary/Chest: Effort normal and breath sounds normal. No respiratory distress. She has no wheezes. She has no rales.    Abdominal: Soft. She exhibits no distension. There is no tenderness. There is no rebound, no guarding and no CVA tenderness.  Musculoskeletal: She exhibits no edema.  Neurological: She is alert and oriented to person, place, and time.  Skin: Skin is warm and dry. Capillary refill takes less than 2 seconds. No rash noted.  Psychiatric: She has a normal mood and affect. Her behavior is normal.  Vitals reviewed.    Results for orders placed or performed in visit on 03/17/18  POCT Urinalysis Dipstick  Result Value Ref Range   Color, UA dark yellow    Clarity, UA cloudy    Glucose, UA Negative Negative   Bilirubin, UA negative    Ketones, UA negative    Spec Grav, UA 1.010 1.010 - 1.025   Blood, UA nonhemolyzed moderate    pH, UA 7.0 5.0 - 8.0   Protein, UA Positive (A) Negative   Urobilinogen, UA 0.2 0.2 or 1.0 E.U./dL   Nitrite, UA negative    Leukocytes, UA Moderate (2+) (A) Negative   Appearance     Odor         Assessment & Plan:   1. Cystitis with hematuria - symptoms and UA c/w likely UTI - send Urine micro as there is trace blood on dipstick - if there is true hematuria, recheck urine in 6 weeks to ensure resolution - send urine culture to ensure sensitivities - no signs of pyelonephritis - treat with Keflex x5d - has PCN allergy, but no anaphylaxis, so discussed low cross reactivity - return precautions discussed - POCT Urinalysis Dipstick - Urine Culture - Urinalysis, microscopic only    Meds ordered this encounter  Medications  . cephALEXin (KEFLEX) 500 MG capsule    Sig: Take 1 capsule (500 mg total) by mouth 2 (two) times daily for 5 days.    Dispense:  10 capsule    Refill:  0     Return if symptoms worsen or fail to improve.   The entirety of the information documented in the History of Present Illness, Review of Systems and Physical Exam were personally obtained by me. Portions of this information were initially documented by Presley Raddle, CMA  and reviewed by me for thoroughness and accuracy.    Erasmo Downer, MD, MPH [facility] 03/17/2018 9:32 AM

## 2018-03-18 LAB — URINALYSIS, MICROSCOPIC ONLY
CASTS: NONE SEEN /LPF
WBC, UA: >30 /hpf — AB (ref 0–5)

## 2018-03-19 LAB — URINE CULTURE

## 2018-03-21 ENCOUNTER — Telehealth: Payer: Self-pay

## 2018-03-21 NOTE — Telephone Encounter (Signed)
-----   Message from Erasmo DownerAngela M Bacigalupo, MD sent at 03/18/2018 12:09 PM EDT ----- No true blood in urine sample.  Still awaiting culture, but does still look like a UTI  Bacigalupo, Marzella SchleinAngela M, MD, MPH [facility] 03/18/2018 12:09 PM

## 2018-03-21 NOTE — Telephone Encounter (Signed)
[email or web address]

## 2018-03-21 NOTE — Telephone Encounter (Signed)
-----   Message from Erasmo DownerAngela M Bacigalupo, MD sent at 03/21/2018  8:47 AM EDT ----- Urine culture does confirm UTI from E coli.  It is sensitive to antibiotics that were Rx'd.  Hope she feels better  Erasmo DownerBacigalupo, Angela M, MD, MPH [facility] 03/21/2018 8:47 AM

## 2018-03-24 NOTE — Telephone Encounter (Signed)
[email or web address]

## 2018-03-24 NOTE — Telephone Encounter (Signed)
Pt returned call. Pt stated that she saw her lab results online and she has been taking the medication and is feeling better. Pt stated unless there was something else she needed to know she doesn't have any questions about her results. Please advise. Thanks TNP

## 2018-04-04 ENCOUNTER — Other Ambulatory Visit: Payer: Self-pay | Admitting: Family Medicine

## 2018-04-04 DIAGNOSIS — Z1231 Encounter for screening mammogram for malignant neoplasm of breast: Secondary | ICD-10-CM

## 2018-05-20 ENCOUNTER — Ambulatory Visit
Admission: RE | Admit: 2018-05-20 | Discharge: 2018-05-20 | Disposition: A | Discharge status: Home / Self Care | Payer: BC Managed Care – PPO | Source: Ambulatory Visit | Attending: Family Medicine | Admitting: Family Medicine

## 2018-05-20 DIAGNOSIS — Z1231 Encounter for screening mammogram for malignant neoplasm of breast: Secondary | ICD-10-CM | POA: Diagnosis present

## 2018-06-03 ENCOUNTER — Ambulatory Visit: Payer: Self-pay | Admitting: Family Medicine

## 2018-06-08 ENCOUNTER — Encounter: Payer: Self-pay | Admitting: Family Medicine

## 2018-06-08 ENCOUNTER — Ambulatory Visit: Payer: BC Managed Care – PPO | Admitting: Family Medicine

## 2018-06-08 VITALS — BP 94/63 | HR 71 | Temp 98.1°F | Resp 16 | Wt 201.0 lb

## 2018-06-08 DIAGNOSIS — R252 Cramp and spasm: Secondary | ICD-10-CM

## 2018-06-08 DIAGNOSIS — I10 Essential (primary) hypertension: Secondary | ICD-10-CM | POA: Diagnosis not present

## 2018-06-08 DIAGNOSIS — R739 Hyperglycemia, unspecified: Secondary | ICD-10-CM

## 2018-06-08 DIAGNOSIS — Z87891 Personal history of nicotine dependence: Secondary | ICD-10-CM | POA: Diagnosis not present

## 2018-06-08 DIAGNOSIS — E782 Mixed hyperlipidemia: Secondary | ICD-10-CM

## 2018-06-08 MED ORDER — HYDROCHLOROTHIAZIDE 12.5 MG PO TABS: 12.5000 mg | ORAL_TABLET | Freq: Every day | ORAL | 3 refills | Status: DC

## 2018-06-08 NOTE — Progress Notes (Signed)
Patient: Bonnie Duffy Female    DOB: July 01, 1955   62 y.o.   MRN: [id number] Visit Date: 06/10/2018  Today's Provider: Shirlee Latch, MD   Chief Complaint  Patient presents with  . Follow-up   Subjective:    HPI   Hypertension, follow-up:  BP Readings from Last 3 Encounters:  06/08/18 94/63  03/17/18 124/74  11/18/17 130/78    She was last seen for hypertension 7 months ago.  BP at that visit was 130/78. Management since that visit includes; labs checked, no changes.She reports good compliance with treatment. She is not having side effects. none She is exercising. She is adherent to low salt diet.   Outside blood pressures are not checking. She is experiencing none.  Patient denies none.   Cardiovascular risk factors include none.  Use of agents associated with hypertension: none.   ---------------------------------------------------------------    Lipid/Cholesterol, Follow-up:   Last seen for this 7 months ago.  Management since that visit includes; labs checked, no changes.  Last Lipid Panel:    Component Value Date/Time   CHOL 186 06/08/2018 1039   TRIG 149 06/08/2018 1039   HDL 46 06/08/2018 1039   CHOLHDL 4.0 06/08/2018 1039   LDLCALC 110 (H) 06/08/2018 1039    She reports good compliance with treatment. She is not having side effects. none  Wt Readings from Last 3 Encounters:  06/08/18 201 lb (91.2 kg)  03/17/18 193 lb 12.8 oz (87.9 kg)  11/18/17 195 lb (88.5 kg)    ---------------------------------------------------------------  Blood Glucose Elevated From 11/18/2017-labs checked. Recommended low carb diet and exercise at least 5 times weekly for at least 30 min/day.  Patient quit smoking 3 to 4 months ago.  She is feeling much better not short of breath anymore.  She still has cravings occasionally.  She was able to quit by cutting back slowly with nicotine gum.  Patient also reports she is waking up intermittently at night  with leg cramps.  This been going on for several months.  She has not noticed anything that makes it worse or better.    Allergies  Allergen Reactions  . Penicillins     No reactions noted.   . Sulfamethoxazole Rash     Current Outpatient Medications:  .  ALPRAZolam (XANAX) 0.25 MG tablet, 1/2 TABLET 2 TO 3 TIMES daily  AS NEEDED, Disp: , Rfl:  .  fenofibrate (TRICOR) 145 MG tablet, TAKE 1 TABLET (145 MG TOTAL) DAILY BY MOUTH., Disp: 30 tablet, Rfl: 5 .  fexofenadine (ALLEGRA) 180 MG tablet, Take by mouth., Disp: , Rfl:  .  fluticasone (FLONASE) 50 MCG/ACT nasal spray, Place 2 sprays into both nostrils daily., Disp: 16 g, Rfl: 11 .  metoprolol succinate (TOPROL-XL) 50 MG 24 hr tablet, TAKE 1/2 TABLET BY MOUTH EVERY DAY, Disp: , Rfl:  .  Multiple Vitamins-Minerals (MULTIVITAMIN PO), Take by mouth., Disp: , Rfl:  .  Omega-3 Fatty Acids (FISH OIL) 1200 MG CAPS, Take by mouth., Disp: , Rfl:  .  rosuvastatin (CRESTOR) 10 MG tablet, TAKE 1 TABLET (10 MG TOTAL) DAILY BY MOUTH., Disp: 90 tablet, Rfl: 1 .  TOVIAZ 8 MG TB24 tablet, TAKE 1 TABLET (8 MG TOTAL) DAILY BY MOUTH., Disp: 30 tablet, Rfl: 5 .  vitamin C (ASCORBIC ACID) 500 MG tablet, Take 500 mg daily by mouth., Disp: , Rfl:  .  Vitamin D, Ergocalciferol, 2000 units CAPS, Take by mouth., Disp: , Rfl:  .  hydrochlorothiazide (HYDRODIURIL) 12.5  MG tablet, Take 1 tablet (12.5 mg total) by mouth daily., Disp: 90 tablet, Rfl: 3  Review of Systems  Constitutional: Negative for appetite change, chills, fatigue and fever.  Respiratory: Negative for chest tightness and shortness of breath.   Cardiovascular: Negative for chest pain and palpitations.  Gastrointestinal: Negative for abdominal pain, nausea and vomiting.  Neurological: Negative for dizziness and weakness.    Social History   Tobacco Use  . Smoking status: Former Smoker    Packs/day: 1.00    Years: 46.00    Pack years: 46.00    Types: Cigarettes    Last attempt to quit:  03/03/2018    Years since quitting: 0.2  . Smokeless tobacco: Never Used  . Tobacco comment: started smoking at age 62; has quit smoking for more than 10 years altogther in the past  Substance Use Topics  . Alcohol use: Yes    Comment: one drink per month; wine   Objective:   BP 94/63 (BP Location: Right Arm, Patient Position: Sitting, Cuff Size: Large)   Pulse 71   Temp 98.1 F (36.7 C) (Oral)   Resp 16   Wt 201 lb (91.2 kg)   SpO2 96%   BMI 34.50 kg/m  Vitals:   06/08/18 0956  BP: 94/63  Pulse: 71  Resp: 16  Temp: 98.1 F (36.7 C)  TempSrc: Oral  SpO2: 96%  Weight: 201 lb (91.2 kg)     Physical Exam Vitals signs reviewed.  Constitutional:      General: She is not in acute distress.    Appearance: She is well-developed. She is not diaphoretic.  HENT:     Head: Normocephalic and atraumatic.  Eyes:     General: No scleral icterus.    Conjunctiva/sclera: Conjunctivae normal.     Pupils: Pupils are equal, round, and reactive to light.  Neck:     Musculoskeletal: Neck supple.     Thyroid: No thyromegaly.  Cardiovascular:     Rate and Rhythm: Normal rate and regular rhythm.     Heart sounds: Normal heart sounds. No murmur.  Pulmonary:     Effort: Pulmonary effort is normal. No respiratory distress.     Breath sounds: Normal breath sounds. No wheezing or rales.  Musculoskeletal:        General: No deformity.     Right lower leg: No edema.     Left lower leg: No edema.  Lymphadenopathy:     Cervical: No cervical adenopathy.  Skin:    General: Skin is warm and dry.     Capillary Refill: Capillary refill takes less than 2 seconds.     Findings: No rash.  Neurological:     Mental Status: She is alert and oriented to person, place, and time.  Psychiatric:        Mood and Affect: Mood normal.        Behavior: Behavior normal.        Thought Content: Thought content normal.       Assessment & Plan:   Problem List Items Addressed This Visit       Cardiovascular and Mediastinum   Essential hypertension - Primary    Slightly hypotensive Blood pressure likely decreased due to recent quitting of smoking We will decrease her medications from lisinopril HCTZ 10-12.5 mg daily to only HCTZ 12.5 mg daily Check metabolic panel      Relevant Medications   hydrochlorothiazide (HYDRODIURIL) 12.5 MG tablet   Other Relevant Orders   Comprehensive metabolic  panel (Completed)     Other   Hyperlipidemia    Previously well controlled Doing well without side effects Continue Crestor and fenofibrate Recheck fasting lipid panel and CMP      Relevant Medications   hydrochlorothiazide (HYDRODIURIL) 12.5 MG tablet   Other Relevant Orders   Lipid panel (Completed)   Comprehensive metabolic panel (Completed)   Blood glucose elevated    Discussed prediabetes Not on medications Recheck A1c Discussed low-carb diet and exercise      Relevant Orders   Hemoglobin A1c (Completed)   Former smoker    Congratulated patient on quitting smoking      Leg cramps    Could be related to HCTZ, but patient not interested in stopping this as she previously had leg swelling prior to taking it Check electrolytes and TSH Discussed using tonic water at bedtime and importance of stretching      Relevant Orders   Magnesium   TSH       Return in about 6 months (around 12/08/2018) for CPE (after 11/19/18).   The entirety of the information documented in the History of Present Illness, Review of Systems and Physical Exam were personally obtained by me. Portions of this information were initially documented by April Miller, CMA and reviewed by me for thoroughness and accuracy.    Erasmo Downer, MD, MPH [facility] 06/10/2018 9:06 AM

## 2018-06-08 NOTE — Patient Instructions (Signed)
Stop lisinopril-HCTZ Start HCTZ 12.5mg  daily

## 2018-06-09 LAB — COMPREHENSIVE METABOLIC PANEL
A/G RATIO: 1.9 (ref 1.2–2.2)
ALK PHOS: 49 IU/L (ref 39–117)
ALT: 37 IU/L — ABNORMAL HIGH (ref 0–32)
AST: 22 IU/L (ref 0–40)
Albumin: 4.3 g/dL (ref 3.6–4.8)
BILIRUBIN TOTAL: 0.4 mg/dL (ref 0.0–1.2)
BUN / CREAT RATIO: 18 (ref 12–28)
BUN: 14 mg/dL (ref 8–27)
CALCIUM: 9.8 mg/dL (ref 8.7–10.3)
CHLORIDE: 102 mmol/L (ref 96–106)
CO2: 23 mmol/L (ref 20–29)
Creatinine, Ser: 0.78 mg/dL (ref 0.57–1.00)
GFR calc Af Amer: 95 mL/min/{1.73_m2} (ref >59)
GFR, EST NON AFRICAN AMERICAN: 82 mL/min/{1.73_m2} (ref >59)
GLOBULIN, TOTAL: 2.3 g/dL (ref 1.5–4.5)
Glucose: 85 mg/dL (ref 65–99)
Potassium: 4.1 mmol/L (ref 3.5–5.2)
Sodium: 142 mmol/L (ref 134–144)
TOTAL PROTEIN: 6.6 g/dL (ref 6.0–8.5)

## 2018-06-09 LAB — MAGNESIUM: Magnesium: 2 mg/dL (ref 1.6–2.3)

## 2018-06-09 LAB — TSH: TSH: 2.17 u[IU]/mL (ref 0.450–4.500)

## 2018-06-09 LAB — LIPID PANEL
Chol/HDL Ratio: 4 ratio (ref 0.0–4.4)
Cholesterol, Total: 186 mg/dL (ref 100–199)
HDL: 46 mg/dL (ref >39)
LDL Calculated: 110 mg/dL — ABNORMAL HIGH (ref 0–99)
Triglycerides: 149 mg/dL (ref 0–149)
VLDL CHOLESTEROL CAL: 30 mg/dL (ref 5–40)

## 2018-06-09 LAB — HEMOGLOBIN A1C
Est. average glucose Bld gHb Est-mCnc: 126 mg/dL
HEMOGLOBIN A1C: 6 % — AB (ref 4.8–5.6)

## 2018-06-10 DIAGNOSIS — R252 Cramp and spasm: Secondary | ICD-10-CM | POA: Insufficient documentation

## 2018-06-10 NOTE — Assessment & Plan Note (Signed)
Previously well controlled Doing well without side effects Continue Crestor and fenofibrate Recheck fasting lipid panel and CMP

## 2018-06-10 NOTE — Assessment & Plan Note (Signed)
Congratulated patient on quitting smoking 

## 2018-06-10 NOTE — Assessment & Plan Note (Signed)
Could be related to HCTZ, but patient not interested in stopping this as she previously had leg swelling prior to taking it Check electrolytes and TSH Discussed using tonic water at bedtime and importance of stretching

## 2018-06-10 NOTE — Assessment & Plan Note (Signed)
Slightly hypotensive Blood pressure likely decreased due to recent quitting of smoking We will decrease her medications from lisinopril HCTZ 10-12.5 mg daily to only HCTZ 12.5 mg daily Check metabolic panel

## 2018-06-10 NOTE — Assessment & Plan Note (Signed)
Discussed prediabetes Not on medications Recheck A1c Discussed low-carb diet and exercise

## 2018-06-25 ENCOUNTER — Ambulatory Visit (INDEPENDENT_AMBULATORY_CARE_PROVIDER_SITE_OTHER): Payer: BC Managed Care – PPO | Admitting: Physician Assistant

## 2018-06-25 ENCOUNTER — Encounter: Payer: Self-pay | Admitting: Physician Assistant

## 2018-06-25 VITALS — BP 126/75 | HR 74 | Temp 98.8°F | Resp 16 | Ht 64.0 in | Wt 206.0 lb

## 2018-06-25 DIAGNOSIS — J0140 Acute pansinusitis, unspecified: Secondary | ICD-10-CM | POA: Diagnosis not present

## 2018-06-25 MED ORDER — DOXYCYCLINE HYCLATE 100 MG PO TABS
100.0000 mg | ORAL_TABLET | Freq: Two times a day (BID) | ORAL | 0 refills | Status: DC
Start: 2018-06-25 — End: 2018-12-07

## 2018-06-25 NOTE — Progress Notes (Signed)
Patient: Bonnie Duffy Female    DOB: May 13, 1956   62 y.o.   MRN: [id number] Visit Date: 06/25/2018  Today's Provider: Margaretann LovelessJennifer M Sharece Fleischhacker, PA-C   Chief Complaint  Patient presents with  . Sinusitis    possible   Subjective:     Sinusitis  This is a new problem. The current episode started in the past 7 days (about 4 days). The problem has been gradually worsening since onset. There has been no fever. The pain is mild. Associated symptoms include congestion, ear pain, headaches, sinus pressure and sneezing. Pertinent negatives include no sore throat. Past treatments include acetaminophen. The treatment provided mild relief.    Allergies  Allergen Reactions  . Penicillins     No reactions noted.   . Sulfamethoxazole Rash     Current Outpatient Medications:  .  ALPRAZolam (XANAX) 0.25 MG tablet, 1/2 TABLET 2 TO 3 TIMES daily  AS NEEDED, Disp: , Rfl:  .  fenofibrate (TRICOR) 145 MG tablet, TAKE 1 TABLET (145 MG TOTAL) DAILY BY MOUTH., Disp: 30 tablet, Rfl: 5 .  fexofenadine (ALLEGRA) 180 MG tablet, Take by mouth., Disp: , Rfl:  .  fluticasone (FLONASE) 50 MCG/ACT nasal spray, Place 2 sprays into both nostrils daily., Disp: 16 g, Rfl: 11 .  hydrochlorothiazide (HYDRODIURIL) 12.5 MG tablet, Take 1 tablet (12.5 mg total) by mouth daily., Disp: 90 tablet, Rfl: 3 .  metoprolol succinate (TOPROL-XL) 50 MG 24 hr tablet, TAKE 1/2 TABLET BY MOUTH EVERY DAY, Disp: , Rfl:  .  Multiple Vitamins-Minerals (MULTIVITAMIN PO), Take by mouth., Disp: , Rfl:  .  Omega-3 Fatty Acids (FISH OIL) 1200 MG CAPS, Take by mouth., Disp: , Rfl:  .  rosuvastatin (CRESTOR) 10 MG tablet, TAKE 1 TABLET (10 MG TOTAL) DAILY BY MOUTH., Disp: 90 tablet, Rfl: 1 .  TOVIAZ 8 MG TB24 tablet, TAKE 1 TABLET (8 MG TOTAL) DAILY BY MOUTH., Disp: 30 tablet, Rfl: 5 .  vitamin C (ASCORBIC ACID) 500 MG tablet, Take 500 mg daily by mouth., Disp: , Rfl:  .  Vitamin D, Ergocalciferol, 2000 units CAPS, Take by mouth., Disp: ,  Rfl:   Review of Systems  Constitutional: Negative.   HENT: Positive for congestion, ear pain, postnasal drip, sinus pressure, sinus pain and sneezing. Negative for sore throat.   Respiratory: Negative.   Cardiovascular: Negative.   Neurological: Positive for headaches.    Social History   Tobacco Use  . Smoking status: Former Smoker    Packs/day: 1.00    Years: 46.00    Pack years: 46.00    Types: Cigarettes    Last attempt to quit: 03/03/2018    Years since quitting: 0.3  . Smokeless tobacco: Never Used  . Tobacco comment: started smoking at age [age]; has quit smoking for more than 10 years altogther in the past  Substance Use Topics  . Alcohol use: Yes    Comment: one drink per month; wine      Objective:   BP 126/75 (BP Location: Left Arm, Patient Position: Sitting, Cuff Size: Large) Comment: electronic cuff  Pulse 74   Temp 98.8 F (37.1 C)   Resp 16   Ht 5\' 4"  (1.626 m)   Wt 206 lb (93.4 kg)   SpO2 96%   BMI 35.36 kg/m  Vitals:   06/25/18 1020  BP: 126/75  Pulse: 74  Resp: 16  Temp: 98.8 F (37.1 C)  SpO2: 96%  Weight: 206 lb (93.4 kg)  Height:  5\' 4"  (1.626 m)     Physical Exam Vitals signs reviewed.  Constitutional:      General: She is not in acute distress.    Appearance: She is well-developed. She is not diaphoretic.  HENT:     Head: Normocephalic and atraumatic.     Right Ear: Hearing, tympanic membrane, ear canal and external ear normal.     Left Ear: Hearing, ear canal and external ear normal. A middle ear effusion (slight opaque line of drainage noted in the TM from about 4-6 o'clock) is present.     Nose:     Right Sinus: Maxillary sinus tenderness and frontal sinus tenderness present.     Left Sinus: Maxillary sinus tenderness and frontal sinus tenderness present.     Mouth/Throat:     Pharynx: Uvula midline. No oropharyngeal exudate.  Neck:     Musculoskeletal: Normal range of motion and neck supple.     Thyroid: No thyromegaly.      Trachea: No tracheal deviation.  Cardiovascular:     Rate and Rhythm: Normal rate and regular rhythm.     Heart sounds: Normal heart sounds. No murmur. No friction rub. No gallop.   Pulmonary:     Effort: Pulmonary effort is normal. No respiratory distress.     Breath sounds: Normal breath sounds. No stridor. No wheezing or rales.  Lymphadenopathy:     Cervical: No cervical adenopathy.         Assessment & Plan    1. Acute non-recurrent pansinusitis Worsening symptoms that have not responded to OTC medications. Will give Doxycycline as below. Continue allergy medications. Stay well hydrated and get plenty of rest. Call if no symptom improvement or if symptoms worsen. - doxycycline (VIBRA-TABS) 100 MG tablet; Take 1 tablet (100 mg total) by mouth 2 (two) times daily.  Dispense: 20 tablet; Refill: 0     Margaretann LovelessJennifer M Vickee Mormino, PA-C  [facility]

## 2018-07-02 ENCOUNTER — Ambulatory Visit: Payer: BC Managed Care – PPO | Admitting: Physician Assistant

## 2018-07-10 ENCOUNTER — Other Ambulatory Visit: Payer: Self-pay | Admitting: Family Medicine

## 2018-07-28 ENCOUNTER — Telehealth: Payer: Self-pay

## 2018-07-28 NOTE — Telephone Encounter (Signed)
Patient  States [employer] denied coverage for Cologuard. She received a bill for $649.00. She would like to know what she needs to do. Please advise.  CB# [phone].

## 2018-07-29 ENCOUNTER — Encounter: Payer: Self-pay | Admitting: Family Medicine

## 2018-07-29 NOTE — Telephone Encounter (Signed)
Any thoughts about this? Can we connect with the Cologuard Rep?

## 2018-08-04 ENCOUNTER — Encounter: Payer: Self-pay | Admitting: Family Medicine

## 2018-08-05 MED ORDER — METOPROLOL SUCCINATE ER 50 MG PO TB24: 25.0000 mg | ORAL_TABLET | Freq: Every day | ORAL | 1 refills | Status: DC

## 2018-08-05 NOTE — Telephone Encounter (Signed)
Was sent to [facility] at 8:42 am this morning.

## 2018-08-25 ENCOUNTER — Encounter: Payer: Self-pay | Admitting: Family Medicine

## 2018-08-28 ENCOUNTER — Encounter: Payer: Self-pay | Admitting: Family Medicine

## 2018-08-29 ENCOUNTER — Encounter: Payer: Self-pay | Admitting: Family Medicine

## 2018-10-05 ENCOUNTER — Other Ambulatory Visit: Payer: Self-pay | Admitting: Family Medicine

## 2018-12-07 NOTE — Progress Notes (Signed)
Patient: Bonnie Duffy Female    DOB: Jun 08, 1956   63 y.o.   MRN: [id number] Visit Date: 12/08/2018  Today's Provider: Shirlee LatchAngela Katriel Cutsforth, MD   Chief Complaint  Patient presents with  . Hypertension  . Hyperlipidemia   Subjective:    Virtual Visit via Video Note  I connected with Bonnie Duffy on 12/08/18 at  9:00 AM EDT by a video enabled telemedicine application and verified that I am speaking with the correct person using two identifiers.   Patient location: home [facility] Persons involved in the visit: patient, provider   I discussed the limitations of evaluation and management by telemedicine and the availability of in person appointments. The patient expressed understanding and agreed to proceed.   HPI  Hypertension, follow-up:     BP Readings from Last 3 Encounters:  06/08/18 94/63  03/17/18 124/74  11/18/17 130/78   She was last seen for hypertension 6 months ago.  BP at that visit was 94/63. Management since that visit includes decreased Lisinopril-HCTZ 10-12.5 mg to only HCTZ 12.5 mg daily. Then BP elevated so started back Lisinopril-HCTZ She reports good compliance with treatment. She is not having side effects. none She is exercising. She is adherent to low salt diet.   Outside blood pressures are being checked. She is experiencing none.  Patient denies none.   Cardiovascular risk factors include none.  Use of agents associated with hypertension: none.   ---------------------------------------------------------------   Lipid/Cholesterol, Follow-up:   Last seen for this 6 months ago.  Management since that visit includes no changes  Last Lipid Panel: Lab Results  Component Value Date   CHOL 186 06/08/2018   HDL 46 06/08/2018   LDLCALC 110 (H) 06/08/2018   TRIG 149 06/08/2018   CHOLHDL 4.0 06/08/2018   She reports good compliance with treatment. She is not having side effects. none  Wt  Readings from Last 3 Encounters:  06/25/18 206 lb (93.4 kg)  06/08/18 201 lb (91.2 kg)  03/17/18 193 lb 12.8 oz (87.9 kg)    ---------------------------------------------------------------   Allergies  Allergen Reactions  . Penicillins     No reactions noted.   . Sulfamethoxazole Rash     Current Outpatient Medications:  .  ALPRAZolam (XANAX) 0.25 MG tablet, 1/2 TABLET 2 TO 3 TIMES daily  AS NEEDED, Disp: , Rfl:  .  fenofibrate (TRICOR) 145 MG tablet, TAKE 1 TABLET (145 MG TOTAL) DAILY BY MOUTH., Disp: 90 tablet, Rfl: 3 .  fexofenadine (ALLEGRA) 180 MG tablet, Take by mouth., Disp: , Rfl:  .  fluticasone (FLONASE) 50 MCG/ACT nasal spray, Place 2 sprays into both nostrils daily., Disp: 16 g, Rfl: 11 .  hydrochlorothiazide (HYDRODIURIL) 12.5 MG tablet, Take 1 tablet (12.5 mg total) by mouth daily., Disp: 90 tablet, Rfl: 3 .  metoprolol succinate (TOPROL-XL) 50 MG 24 hr tablet, Take 1 tablet (50 mg total) by mouth daily. Take with or immediately following a meal., Disp: 90 tablet, Rfl: 1 .  Multiple Vitamins-Minerals (MULTIVITAMIN PO), Take by mouth., Disp: , Rfl:  .  Omega-3 Fatty Acids (FISH OIL) 1200 MG CAPS, Take by mouth., Disp: , Rfl:  .  rosuvastatin (CRESTOR) 10 MG tablet, TAKE 1 TABLET BY MOUTH EVERY DAY, Disp: 90 tablet, Rfl: 1 .  TOVIAZ 8 MG TB24 tablet, TAKE 1 TABLET (8 MG TOTAL) DAILY BY MOUTH., Disp: 90 tablet, Rfl: 3 .  vitamin C (ASCORBIC ACID) 500 MG tablet, Take 500 mg daily by mouth.,  Disp: , Rfl:  .  Vitamin D, Ergocalciferol, 2000 units CAPS, Take by mouth., Disp: , Rfl:    Review of Systems  Constitutional: Negative.   Respiratory: Negative.   Cardiovascular: Negative.   Musculoskeletal: Negative.     Social History   Tobacco Use  . Smoking status: Former Smoker    Packs/day: 1.00    Years: 46.00    Pack years: 46.00    Types: Cigarettes    Quit date: 03/03/2018    Years since quitting: 0.7  . Smokeless tobacco: Never Used  . Tobacco comment:  started smoking at age 70; has quit smoking for more than 10 years altogther in the past  Substance Use Topics  . Alcohol use: Yes    Comment: one drink per month; wine      Objective:   BP 123/84 (BP Location: Left Arm, Patient Position: Sitting, Cuff Size: Normal)   Pulse 76  Vitals:   12/08/18 0826  BP: 123/84  Pulse: 76     Physical Exam Constitutional:      Appearance: Normal appearance.  Pulmonary:     Effort: Pulmonary effort is normal. No respiratory distress.  Neurological:     Mental Status: She is alert and oriented to person, place, and time. Mental status is at baseline.  Psychiatric:        Mood and Affect: Mood normal.        Behavior: Behavior normal.         Assessment & Plan    I discussed the assessment and treatment plan with the patient. The patient was provided an opportunity to ask questions and all were answered. The patient agreed with the plan and demonstrated an understanding of the instructions.   The patient was advised to call back or seek an in-person evaluation if the symptoms worsen or if the condition fails to improve as anticipated.   Problem List Items Addressed This Visit      Cardiovascular and Mediastinum   Essential hypertension - Primary    Well-controlled Continue lisinopril HCTZ 10-12.5 mg daily Recheck metabolic panel      Relevant Orders   Comprehensive metabolic panel     Other   Hyperlipidemia    Doing well without side effects Continue Crestor and fenofibrate Recheck fasting lipid panel and CMP      Relevant Orders   Lipid panel   Comprehensive metabolic panel   Prediabetes    Discussed risks of prediabetes Not on medications Recheck A1c Continue low carb diet Encouraged exercise      Relevant Orders   Hemoglobin A1c   Avitaminosis D    Recheck Vit D Continue supplement      Relevant Orders   VITAMIN D 25 Hydroxy (Vit-D Deficiency, Fractures)       Return in about 6 months (around  06/09/2019) for CPE.   The entirety of the information documented in the History of Present Illness, Review of Systems and Physical Exam were personally obtained by me. Portions of this information were initially documented by Tiburcio Pea, CMA and reviewed by me for thoroughness and accuracy.    Kalliopi Coupland, Dionne Bucy, MD MPH [facility]

## 2018-12-08 ENCOUNTER — Ambulatory Visit (INDEPENDENT_AMBULATORY_CARE_PROVIDER_SITE_OTHER): Payer: BC Managed Care – PPO | Admitting: Family Medicine

## 2018-12-08 ENCOUNTER — Encounter: Payer: Self-pay | Admitting: Family Medicine

## 2018-12-08 VITALS — BP 123/84 | HR 76

## 2018-12-08 DIAGNOSIS — I10 Essential (primary) hypertension: Secondary | ICD-10-CM

## 2018-12-08 DIAGNOSIS — R7303 Prediabetes: Secondary | ICD-10-CM

## 2018-12-08 DIAGNOSIS — E782 Mixed hyperlipidemia: Secondary | ICD-10-CM | POA: Diagnosis not present

## 2018-12-08 DIAGNOSIS — E559 Vitamin D deficiency, unspecified: Secondary | ICD-10-CM | POA: Diagnosis not present

## 2018-12-08 NOTE — Assessment & Plan Note (Signed)
Doing well without side effects Continue Crestor and fenofibrate Recheck fasting lipid panel and CMP

## 2018-12-08 NOTE — Assessment & Plan Note (Signed)
Well-controlled Continue lisinopril HCTZ 10-12.5 mg daily Recheck metabolic panel

## 2018-12-08 NOTE — Assessment & Plan Note (Signed)
Recheck Vit D Continue supplement 

## 2018-12-08 NOTE — Assessment & Plan Note (Signed)
Discussed risks of prediabetes Not on medications Recheck A1c Continue low carb diet Encouraged exercise

## 2018-12-15 ENCOUNTER — Ambulatory Visit (INDEPENDENT_AMBULATORY_CARE_PROVIDER_SITE_OTHER): Payer: BC Managed Care – PPO | Admitting: Physician Assistant

## 2018-12-15 ENCOUNTER — Other Ambulatory Visit: Payer: Self-pay

## 2018-12-15 DIAGNOSIS — Z23 Encounter for immunization: Secondary | ICD-10-CM | POA: Diagnosis not present

## 2018-12-16 ENCOUNTER — Telehealth: Payer: Self-pay | Admitting: Family Medicine

## 2018-12-16 ENCOUNTER — Telehealth: Payer: Self-pay

## 2018-12-16 LAB — COMPREHENSIVE METABOLIC PANEL
ALT: 59 IU/L — ABNORMAL HIGH (ref 0–32)
AST: 40 IU/L (ref 0–40)
Albumin/Globulin Ratio: 2.3 — ABNORMAL HIGH (ref 1.2–2.2)
Albumin: 4.8 g/dL (ref 3.8–4.8)
Alkaline Phosphatase: 45 IU/L (ref 39–117)
BUN/Creatinine Ratio: 19 (ref 12–28)
BUN: 15 mg/dL (ref 8–27)
Bilirubin Total: 0.4 mg/dL (ref 0.0–1.2)
CO2: 22 mmol/L (ref 20–29)
Calcium: 9.8 mg/dL (ref 8.7–10.3)
Chloride: 103 mmol/L (ref 96–106)
Creatinine, Ser: 0.77 mg/dL (ref 0.57–1.00)
GFR calc Af Amer: 96 mL/min/{1.73_m2} (ref >59)
GFR calc non Af Amer: 83 mL/min/{1.73_m2} (ref >59)
Globulin, Total: 2.1 g/dL (ref 1.5–4.5)
Glucose: 96 mg/dL (ref 65–99)
Potassium: 4.2 mmol/L (ref 3.5–5.2)
Sodium: 139 mmol/L (ref 134–144)
Total Protein: 6.9 g/dL (ref 6.0–8.5)

## 2018-12-16 LAB — VITAMIN D 25 HYDROXY (VIT D DEFICIENCY, FRACTURES): Vit D, 25-Hydroxy: 36.3 ng/mL (ref 30.0–100.0)

## 2018-12-16 LAB — HEMOGLOBIN A1C
Est. average glucose Bld gHb Est-mCnc: 126 mg/dL
Hgb A1c MFr Bld: 6 % — ABNORMAL HIGH (ref 4.8–5.6)

## 2018-12-16 LAB — LIPID PANEL
Chol/HDL Ratio: 3.6 ratio (ref 0.0–4.4)
Cholesterol, Total: 160 mg/dL (ref 100–199)
HDL: 45 mg/dL (ref >39)
LDL Calculated: 90 mg/dL (ref 0–99)
Triglycerides: 125 mg/dL (ref 0–149)
VLDL Cholesterol Cal: 25 mg/dL (ref 5–40)

## 2018-12-16 NOTE — Telephone Encounter (Signed)
Pt called saying she was talking to [place] about an address that was different when she was inputting pt's vaccine and was asked to call back  Pt's CB#  [phone]  [place]

## 2018-12-16 NOTE — Telephone Encounter (Signed)
-----   Message from Mar Daring, [place] sent at 12/16/2018  8:26 AM EDT ----- Cholesterol is improved from 6 months ago and normal. Kidney function normal. ALT of liver enzymes has increased since previous from 37 to now 59. This is still a borderline increase. I normally advise people to limit fatty foods, limit tylenol (acetaminophen) based products and limit any alcohol consumption. She can address this further with Dr. B when she returns if desired. I also normally advise to recheck after the above lifestyle modifications in 8 weeks. Vit D is normal. A1c is stable at 6.0.

## 2018-12-16 NOTE — Telephone Encounter (Signed)
Patient advised as directed below. 

## 2018-12-19 NOTE — Telephone Encounter (Signed)
Spoke with patient and and verified information on [facility]

## 2019-03-08 ENCOUNTER — Encounter: Payer: Self-pay | Admitting: Family Medicine

## 2019-03-13 ENCOUNTER — Encounter: Payer: Self-pay | Admitting: Family Medicine

## 2019-03-14 NOTE — Progress Notes (Signed)
Patient: Bonnie RosenthalLaurie Studer Duffy Female    DOB: 08-Jul-1955   63 y.o.   MRN: [id number] Visit Date: 03/15/2019  Today's Provider: Trey SailorsAdriana M Pollak, PA-C   Chief Complaint  Patient presents with  . Urinary Tract Infection   Subjective:     HPI Urinary Tract Infection: Patient complains of does not She has had symptoms for 5 days. Patient also complains of back pain. Patient denies fever. Patient does not have a history of recurrent UTI.  Patient does not have a history of pyelonephritis.   Allergies  Allergen Reactions  . Penicillins     No reactions noted.   . Sulfamethoxazole Rash     Current Outpatient Medications:  .  ALPRAZolam (XANAX) 0.25 MG tablet, 1/2 TABLET 2 TO 3 TIMES daily  AS NEEDED, Disp: , Rfl:  .  fenofibrate (TRICOR) 145 MG tablet, TAKE 1 TABLET (145 MG TOTAL) DAILY BY MOUTH., Disp: 90 tablet, Rfl: 3 .  fexofenadine (ALLEGRA) 180 MG tablet, Take by mouth., Disp: , Rfl:  .  fluticasone (FLONASE) 50 MCG/ACT nasal spray, Place 2 sprays into both nostrils daily., Disp: 16 g, Rfl: 11 .  lisinopril-hydrochlorothiazide (ZESTORETIC) 20-25 MG tablet, , Disp: , Rfl:  .  metoprolol succinate (TOPROL-XL) 50 MG 24 hr tablet, Take 1 tablet (50 mg total) by mouth daily. Take with or immediately following a meal., Disp: 90 tablet, Rfl: 1 .  Multiple Vitamins-Minerals (MULTIVITAMIN PO), Take by mouth., Disp: , Rfl:  .  Omega-3 Fatty Acids (FISH OIL) 1200 MG CAPS, Take by mouth., Disp: , Rfl:  .  rosuvastatin (CRESTOR) 10 MG tablet, TAKE 1 TABLET BY MOUTH EVERY DAY, Disp: 90 tablet, Rfl: 1 .  TOVIAZ 8 MG TB24 tablet, TAKE 1 TABLET (8 MG TOTAL) DAILY BY MOUTH., Disp: 90 tablet, Rfl: 3 .  vitamin C (ASCORBIC ACID) 500 MG tablet, Take 500 mg daily by mouth., Disp: , Rfl:  .  Vitamin D, Ergocalciferol, 2000 units CAPS, Take by mouth., Disp: , Rfl:   Review of Systems  Constitutional: Negative.   Respiratory: Negative.   Gastrointestinal: Negative.   Genitourinary: Positive  for dysuria, flank pain and frequency.    Social History   Tobacco Use  . Smoking status: Former Smoker    Packs/day: 1.00    Years: 46.00    Pack years: 46.00    Types: Cigarettes    Quit date: 03/03/2018    Years since quitting: 1.0  . Smokeless tobacco: Never Used  . Tobacco comment: started smoking at age 63; has quit smoking for more than 10 years altogther in the past  Substance Use Topics  . Alcohol use: Yes    Comment: one drink per month; wine      Objective:   BP 120/70 (BP Location: Left Arm, Patient Position: Sitting, Cuff Size: Large)   Pulse 64   Temp (!) 97.1 F (36.2 C) (Temporal)   Resp 16   Wt 210 lb 6.4 oz (95.4 kg)   SpO2 100%   BMI 36.12 kg/m  Vitals:   03/15/19 1038  BP: 120/70  Pulse: 64  Resp: 16  Temp: (!) 97.1 F (36.2 C)  TempSrc: Temporal  SpO2: 100%  Weight: 210 lb 6.4 oz (95.4 kg)  Body mass index is 36.12 kg/m.   Physical Exam Constitutional:      General: She is not in acute distress.    Appearance: She is well-developed. She is not diaphoretic.  Cardiovascular:  Rate and Rhythm: Normal rate and regular rhythm.  Pulmonary:     Effort: Pulmonary effort is normal.     Breath sounds: Normal breath sounds.  Abdominal:     General: Bowel sounds are normal. There is no distension.     Palpations: Abdomen is soft.     Tenderness: There is abdominal tenderness in the suprapubic area. There is no guarding or rebound.  Skin:    General: Skin is warm and dry.  Neurological:     Mental Status: She is alert and oriented to person, place, and time.  Psychiatric:        Behavior: Behavior normal.      Results for orders placed or performed in visit on 03/15/19  POCT urinalysis dipstick  Result Value Ref Range   Color, UA yellow    Clarity, UA clear    Glucose, UA Negative Negative   Bilirubin, UA Negative    Ketones, UA Negative    Spec Grav, UA 1.010 1.010 - 1.025   Blood, UA Negative    pH, UA 8.0 5.0 - 8.0   Protein,  UA Negative Negative   Urobilinogen, UA 0.2 0.2 or 1.0 E.U./dL   Nitrite, UA Negative    Leukocytes, UA Negative Negative       Assessment & Plan    1. Dysuria  Urine dipstick negative, will culture. Exam benign. Patient tolerated keflex in the past despite PCN allergy. I think it is OK to wait for culture, she may start abx if worsening. Patient agreeable.  - POCT urinalysis dipstick - Urine Culture - cephALEXin (KEFLEX) 500 MG capsule; Take 1 capsule (500 mg total) by mouth 2 (two) times daily for 5 days.  Dispense: 10 capsule; Refill: 0  The entirety of the information documented in the History of Present Illness, Review of Systems and Physical Exam were personally obtained by me. Portions of this information were initially documented by Lynford Humphrey, CMA and reviewed by me for thoroughness and accuracy.   F/u PRN     Trinna Post, PA-C  [facility]

## 2019-03-15 ENCOUNTER — Ambulatory Visit: Payer: BC Managed Care – PPO | Admitting: Physician Assistant

## 2019-03-15 ENCOUNTER — Encounter: Payer: Self-pay | Admitting: Physician Assistant

## 2019-03-15 ENCOUNTER — Other Ambulatory Visit: Payer: Self-pay

## 2019-03-15 VITALS — BP 120/70 | HR 64 | Temp 97.1°F | Resp 16 | Wt 210.4 lb

## 2019-03-15 DIAGNOSIS — R300 Dysuria: Secondary | ICD-10-CM | POA: Diagnosis not present

## 2019-03-15 LAB — POCT URINALYSIS DIPSTICK
Bilirubin, UA: NEGATIVE
Blood, UA: NEGATIVE
Glucose, UA: NEGATIVE
Ketones, UA: NEGATIVE
Leukocytes, UA: NEGATIVE
Nitrite, UA: NEGATIVE
Protein, UA: NEGATIVE
Spec Grav, UA: 1.01
Urobilinogen, UA: 0.2 U/dL
pH, UA: 8

## 2019-03-15 MED ORDER — CEPHALEXIN 500 MG PO CAPS: 500.0000 mg | ORAL_CAPSULE | Freq: Two times a day (BID) | ORAL | 0 refills | Status: AC

## 2019-03-15 NOTE — Patient Instructions (Signed)

## 2019-03-17 LAB — URINE CULTURE

## 2019-04-03 ENCOUNTER — Other Ambulatory Visit: Payer: Self-pay | Admitting: Family Medicine

## 2019-04-11 ENCOUNTER — Other Ambulatory Visit: Payer: Self-pay | Admitting: Physician Assistant

## 2019-04-14 ENCOUNTER — Other Ambulatory Visit: Payer: Self-pay | Admitting: Family Medicine

## 2019-04-14 DIAGNOSIS — Z1231 Encounter for screening mammogram for malignant neoplasm of breast: Secondary | ICD-10-CM

## 2019-05-22 ENCOUNTER — Ambulatory Visit
Admission: RE | Admit: 2019-05-22 | Discharge: 2019-05-22 | Disposition: A | Discharge status: Home / Self Care | Payer: BC Managed Care – PPO | Source: Ambulatory Visit | Attending: Family Medicine | Admitting: Family Medicine

## 2019-05-22 DIAGNOSIS — Z1231 Encounter for screening mammogram for malignant neoplasm of breast: Secondary | ICD-10-CM | POA: Insufficient documentation

## 2019-06-09 ENCOUNTER — Ambulatory Visit (INDEPENDENT_AMBULATORY_CARE_PROVIDER_SITE_OTHER): Payer: BC Managed Care – PPO | Admitting: Family Medicine

## 2019-06-09 ENCOUNTER — Encounter: Payer: Self-pay | Admitting: Family Medicine

## 2019-06-09 ENCOUNTER — Other Ambulatory Visit: Payer: Self-pay

## 2019-06-09 VITALS — BP 101/69 | HR 70 | Temp 97.1°F | Resp 16 | Ht 64.0 in | Wt 214.6 lb

## 2019-06-09 DIAGNOSIS — Z23 Encounter for immunization: Secondary | ICD-10-CM

## 2019-06-09 DIAGNOSIS — I10 Essential (primary) hypertension: Secondary | ICD-10-CM

## 2019-06-09 DIAGNOSIS — R7303 Prediabetes: Secondary | ICD-10-CM | POA: Diagnosis not present

## 2019-06-09 DIAGNOSIS — Z0000 Encounter for general adult medical examination without abnormal findings: Secondary | ICD-10-CM | POA: Diagnosis not present

## 2019-06-09 DIAGNOSIS — E782 Mixed hyperlipidemia: Secondary | ICD-10-CM | POA: Diagnosis not present

## 2019-06-09 NOTE — Assessment & Plan Note (Signed)
Discussed importance of healthy weight management Discussed diet and exercise Associated with HTN, HLD, prediabetes

## 2019-06-09 NOTE — Assessment & Plan Note (Signed)
Well controlled Continue current medications Recheck metabolic panel F/u in 6 months  

## 2019-06-09 NOTE — Progress Notes (Signed)
Patient: Bonnie Duffy, Female    DOB: Sep 02, 1955, 63 y.o.   MRN: [id number] Visit Date: 06/09/2019  Today's Provider: Shirlee LatchAngela Scottie Metayer, MD   Chief Complaint  Patient presents with  . Annual Exam   Subjective:     Annual physical exam Bonnie Duffy is a 63 y.o. female who presents today for health maintenance and complete physical. She feels well. She reports exercising 2-3 days a week. She reports she is sleeping well.  11/18/2017 CPE 11/18/2016 Pap/HPV-negative 05/22/2019 Mammogram-BI-RADS 1 11/26/2006 Colonoscopy-normal -----------------------------------------------------------------   Review of Systems  Constitutional: Negative.   HENT: Positive for tinnitus.   Eyes: Negative.   Respiratory: Negative.   Cardiovascular: Negative.   Gastrointestinal: Negative.   Endocrine: Negative.   Genitourinary: Negative.   Musculoskeletal: Negative.   Skin: Negative.   Allergic/Immunologic: Positive for environmental allergies.  Neurological: Negative.   Hematological: Negative.   Psychiatric/Behavioral: Negative.     Social History      She  reports that she quit smoking about 15 months ago. Her smoking use included cigarettes. She has a 46.00 pack-year smoking history. She has never used smokeless tobacco. She reports current alcohol use. She reports that she does not use drugs.       Social History   Socioeconomic History  . Marital status: Married    Spouse name: Not on file  . Number of children: 2  . Years of education: Not on file  . Highest education level: Master's degree (e.g., MA, MS, MEng, MEd, MSW, MBA)  Occupational History    Employer: [employer]    Comment: retired  Tobacco Use  . Smoking status: Former Smoker    Packs/day: 1.00    Years: 46.00    Pack years: 46.00    Types: Cigarettes    Quit date: 03/03/2018    Years since quitting: 1.2  . Smokeless tobacco: Never Used  . Tobacco comment: started smoking at age 63; has quit  smoking for more than 10 years altogther in the past  Substance and Sexual Activity  . Alcohol use: Yes    Comment: one drink per month; wine  . Drug use: No  . Sexual activity: Not Currently  Other Topics Concern  . Not on file  Social History Narrative  . Not on file   Social Determinants of Health   Financial Resource Strain:   . Difficulty of Paying Living Expenses: Not on file  Food Insecurity:   . Worried About Programme researcher, broadcasting/film/videounning Out of Food in the Last Year: Not on file  . Ran Out of Food in the Last Year: Not on file  Transportation Needs:   . Lack of Transportation (Medical): Not on file  . Lack of Transportation (Non-Medical): Not on file  Physical Activity:   . Days of Exercise per Week: Not on file  . Minutes of Exercise per Session: Not on file  Stress:   . Feeling of Stress : Not on file  Social Connections:   . Frequency of Communication with Friends and Family: Not on file  . Frequency of Social Gatherings with Friends and Family: Not on file  . Attends Religious Services: Not on file  . Active Member of Clubs or Organizations: Not on file  . Attends BankerClub or Organization Meetings: Not on file  . Marital Status: Not on file    Past Medical History:  Diagnosis Date  . Allergy   . Anxiety   . Hyperlipidemia   . Hypertension   .  T2DM (type 2 diabetes mellitus) [place])      Patient Active Problem List   Diagnosis Date Noted  . Morbid obesity (HCC) 06/09/2019  . Leg cramps 06/10/2018  . Constipation 05/10/2017  . Hemorrhoids 05/10/2017  . Overactive bladder 05/10/2017  . Anxiety 05/10/2017  . Hyperlipidemia 05/10/2017  . Prediabetes 05/10/2017  . Former smoker 05/10/2017  . Avitaminosis D 05/10/2017  . Essential hypertension 05/10/2017    Past Surgical History:  Procedure Laterality Date  . EYE SURGERY    . TONSILLECTOMY  1960  . TUBAL LIGATION      Family History        Family Status  Relation Name Status  . Mother  Deceased at age 72  . Father   Deceased  . Sister  Alive  . Brother  Alive  . MGM  (Not Specified)  . MGF  (Not Specified)  . PGM  (Not Specified)  . PGF  (Not Specified)  . Neg Hx  (Not Specified)        Her family history includes Breast cancer (age of onset: 78) in her maternal grandmother; Diabetes in her maternal grandmother; Healthy in her brother; Heart attack in her father; Heart disease in her father, maternal grandfather, maternal grandmother, mother, and paternal grandmother; Leukemia in her paternal grandfather; Lung cancer in her father; Prostate cancer in her father; Skin cancer in her father; Stroke (age of onset: 34) in her sister. There is no history of Colon cancer, Ovarian cancer, or Cervical cancer.      Allergies  Allergen Reactions  . Penicillins     No reactions noted.   . Sulfamethoxazole Rash     Current Outpatient Medications:  .  ALPRAZolam (XANAX) 0.25 MG tablet, 1/2 TABLET 2 TO 3 TIMES daily  AS NEEDED, Disp: , Rfl:  .  fenofibrate (TRICOR) 145 MG tablet, TAKE 1 TABLET (145 MG TOTAL) DAILY BY MOUTH., Disp: 90 tablet, Rfl: 3 .  fexofenadine (ALLEGRA) 180 MG tablet, Take by mouth., Disp: , Rfl:  .  fluticasone (FLONASE) 50 MCG/ACT nasal spray, Place 2 sprays into both nostrils daily., Disp: 16 g, Rfl: 11 .  lisinopril-hydrochlorothiazide (ZESTORETIC) 20-25 MG tablet, , Disp: , Rfl:  .  metoprolol succinate (TOPROL-XL) 50 MG 24 hr tablet, TAKE 1 TABLET (50 MG TOTAL) BY MOUTH DAILY. TAKE WITH OR IMMEDIATELY FOLLOWING A MEAL., Disp: 90 tablet, Rfl: 1 .  Multiple Vitamins-Minerals (MULTIVITAMIN PO), Take by mouth., Disp: , Rfl:  .  Omega-3 Fatty Acids (FISH OIL) 1200 MG CAPS, Take by mouth., Disp: , Rfl:  .  rosuvastatin (CRESTOR) 10 MG tablet, TAKE 1 TABLET BY MOUTH EVERY DAY, Disp: 90 tablet, Rfl: 1 .  TOVIAZ 8 MG TB24 tablet, TAKE 1 TABLET (8 MG TOTAL) DAILY BY MOUTH., Disp: 90 tablet, Rfl: 3 .  vitamin C (ASCORBIC ACID) 500 MG tablet, Take 500 mg daily by mouth., Disp: , Rfl:  .  Vitamin  D, Ergocalciferol, 2000 units CAPS, Take by mouth., Disp: , Rfl:    Patient Care Team: Erasmo Downer, MD as PCP - General (Family Medicine)    Objective:    Vitals: BP 101/69 (BP Location: Left Arm, Patient Position: Sitting, Cuff Size: Large)   Pulse 70   Temp (!) 97.1 F (36.2 C) (Temporal)   Resp 16   Ht  (1.626 m)   Wt 214 lb 9.6 oz (97.3 kg)   BMI 36.84 kg/m    Vitals:   06/09/19 1007  BP: 101/69  Pulse: 70  Resp: 16  Temp: (!) 97.1 F (36.2 C)  TempSrc: Temporal  Weight: 214 lb 9.6 oz (97.3 kg)  Height: 5\' 4"  (1.626 m)     Physical Exam Vitals reviewed.  Constitutional:      General: She is not in acute distress.    Appearance: Normal appearance. She is well-developed. She is not diaphoretic.  HENT:     Head: Normocephalic and atraumatic.     Right Ear: Tympanic membrane, ear canal and external ear normal.     Left Ear: Tympanic membrane, ear canal and external ear normal.  Eyes:     General: No scleral icterus.    Conjunctiva/sclera: Conjunctivae normal.     Pupils: Pupils are equal, round, and reactive to light.  Neck:     Thyroid: No thyromegaly.  Cardiovascular:     Rate and Rhythm: Normal rate and regular rhythm.     Pulses: Normal pulses.     Heart sounds: Normal heart sounds. No murmur.  Pulmonary:     Effort: Pulmonary effort is normal. No respiratory distress.     Breath sounds: Normal breath sounds. No wheezing or rales.  Abdominal:     General: There is no distension.     Palpations: Abdomen is soft.     Tenderness: There is no abdominal tenderness. There is no guarding or rebound.  Musculoskeletal:        General: No deformity.     Cervical back: Neck supple.     Right lower leg: No edema.     Left lower leg: No edema.  Lymphadenopathy:     Cervical: No cervical adenopathy.  Skin:    General: Skin is warm and dry.     Capillary Refill: Capillary refill takes less than 2 seconds.     Findings: No rash.  Neurological:      Mental Status: She is alert and oriented to person, place, and time. Mental status is at baseline.  Psychiatric:        Mood and Affect: Mood normal.        Behavior: Behavior normal.        Thought Content: Thought content normal.      Depression Screen PHQ 2/9 Scores 06/09/2019 12/08/2018 05/10/2017  PHQ - 2 Score 0 0 0  PHQ- 9 Score 1 - -       Assessment & Plan:     Routine Health Maintenance and Physical Exam  Exercise Activities and Dietary recommendations Goals   None     Immunization History  Administered Date(s) Administered  . Hepatitis B 10/03/1992, 11/04/1992, 04/24/1993  . Influenza,inj,Quad PF,6+ Mos 03/29/2017, 03/02/2018  . Influenza,inj,quad, With Preservative 03/10/2010  . Influenza-Unspecified 03/10/2019  . Pneumococcal Polysaccharide-23 03/13/2012  . Td 06/09/2019  . Tdap 10/17/2008  . Zoster Recombinat (Shingrix) 12/15/2018, 06/09/2019    Health Maintenance  Topic Date Due  . HIV Screening  06/13/1971  . PAP SMEAR-Modifier  11/19/2019  . Fecal DNA (Cologuard)  12/20/2020  . MAMMOGRAM  05/21/2021  . TETANUS/TDAP  06/08/2029  . INFLUENZA VACCINE  Completed  . Hepatitis C Screening  Completed     Discussed health benefits of physical activity, and encouraged her to engage in regular exercise appropriate for her age and condition.    --------------------------------------------------------------------  Problem List Items Addressed This Visit      Cardiovascular and Mediastinum   Essential hypertension    Well controlled Continue current medications Recheck metabolic panel F/u in 6 months  Relevant Orders   CMP (Comprehensive metabolic panel)     Other   Hyperlipidemia    Doing well without side effects Continue Crestor and fenofibrate      Relevant Orders   CMP (Comprehensive metabolic panel)   Prediabetes    Recheck A1c Encourage low carb diet      Relevant Orders   Hemoglobin A1c   Morbid obesity (HCC)     Discussed importance of healthy weight management Discussed diet and exercise Associated with HTN, HLD, prediabetes       Other Visit Diagnoses    Encounter for annual physical exam    -  Primary   Relevant Orders   Hemoglobin A1c   CMP (Comprehensive metabolic panel)   Need for shingles vaccine       Relevant Orders   Varicella-zoster vaccine IM (Shingrix) (Completed)   Need for Td vaccine       Relevant Orders   Td : Tetanus/diphtheria >7yo Preservative  free (Completed)       Return in about 6 months (around 12/08/2019) for chronic disease f/u.   The entirety of the information documented in the History of Present Illness, Review of Systems and Physical Exam were personally obtained by me. Portions of this information were initially documented by Rondel Baton, CMA and reviewed by me for thoroughness and accuracy.    Oona Trammel, Marzella Schlein, MD MPH [facility]

## 2019-06-09 NOTE — Assessment & Plan Note (Signed)
Recheck A1c Encourage low-carb diet 

## 2019-06-09 NOTE — Patient Instructions (Signed)

## 2019-06-09 NOTE — Assessment & Plan Note (Signed)
Doing well without side effects Continue Crestor and fenofibrate

## 2019-06-10 LAB — COMPREHENSIVE METABOLIC PANEL
ALT: 81 IU/L — ABNORMAL HIGH (ref 0–32)
AST: 64 IU/L — ABNORMAL HIGH (ref 0–40)
Albumin/Globulin Ratio: 2.3 — ABNORMAL HIGH (ref 1.2–2.2)
Albumin: 4.6 g/dL (ref 3.8–4.8)
Alkaline Phosphatase: 54 IU/L (ref 39–117)
BUN/Creatinine Ratio: 18 (ref 12–28)
BUN: 13 mg/dL (ref 8–27)
Bilirubin Total: 0.4 mg/dL (ref 0.0–1.2)
CO2: 19 mmol/L — ABNORMAL LOW (ref 20–29)
Calcium: 10.1 mg/dL (ref 8.7–10.3)
Chloride: 106 mmol/L (ref 96–106)
Creatinine, Ser: 0.72 mg/dL (ref 0.57–1.00)
GFR calc Af Amer: 104 mL/min/{1.73_m2} (ref >59)
GFR calc non Af Amer: 90 mL/min/{1.73_m2} (ref >59)
Globulin, Total: 2 g/dL (ref 1.5–4.5)
Glucose: 94 mg/dL (ref 65–99)
Potassium: 4.2 mmol/L (ref 3.5–5.2)
Sodium: 141 mmol/L (ref 134–144)
Total Protein: 6.6 g/dL (ref 6.0–8.5)

## 2019-06-10 LAB — HEMOGLOBIN A1C
Est. average glucose Bld gHb Est-mCnc: 126 mg/dL
Hgb A1c MFr Bld: 6 % — ABNORMAL HIGH (ref 4.8–5.6)

## 2019-06-12 ENCOUNTER — Encounter: Payer: Self-pay | Admitting: Family Medicine

## 2019-06-12 ENCOUNTER — Telehealth: Payer: Self-pay

## 2019-06-12 NOTE — Telephone Encounter (Signed)
-----   Message from Virginia Crews, MD sent at 06/12/2019  9:01 AM EST ----- Stable A1c in prediabetic range.  Normal kidney function and electrolytes.  Liver function tests have elevated slightly.  Attempt to stay well-hydrated, avoid alcohol and Tylenol.  Recheck CMP in 1 month.

## 2019-06-12 NOTE — Telephone Encounter (Signed)
Written by Virginia Crews, MD on 06/12/2019 9:01 AM EST Seen by patient Bretta Bang on 06/12/2019 12:50 PM EST

## 2019-06-13 ENCOUNTER — Other Ambulatory Visit: Payer: Self-pay

## 2019-06-13 MED ORDER — LISINOPRIL-HYDROCHLOROTHIAZIDE 20-25 MG PO TABS: 1.0000 | ORAL_TABLET | Freq: Every day | ORAL | 1 refills | Status: DC

## 2019-07-08 ENCOUNTER — Other Ambulatory Visit: Payer: Self-pay | Admitting: Family Medicine

## 2019-09-27 ENCOUNTER — Encounter: Payer: Self-pay | Admitting: Family Medicine

## 2019-09-27 NOTE — Telephone Encounter (Signed)
Form completed.

## 2019-10-09 ENCOUNTER — Other Ambulatory Visit: Payer: Self-pay | Admitting: Family Medicine

## 2019-10-11 ENCOUNTER — Encounter: Payer: Self-pay | Admitting: Family Medicine

## 2019-12-08 ENCOUNTER — Ambulatory Visit: Payer: Self-pay | Admitting: Family Medicine

## 2019-12-15 NOTE — Progress Notes (Signed)
Established patient visit   Patient: Bonnie Duffy   DOB: 1955-12-23   64 y.o. Female  MRN: [id number] Visit Date: 12/18/2019  I,Sulibeya S Dimas,acting as a scribe for Shirlee Latch, MD.,have documented all relevant documentation on the behalf of Shirlee Latch, MD,as directed by  Shirlee Latch, MD while in the presence of Shirlee Latch, MD.  Today's healthcare provider: Shirlee Latch, MD   Chief Complaint  Patient presents with  . Hypertension  . Hyperglycemia   Subjective    HPI   Pt is due to have CMP rechecked secondary to elevated liver enzymes.  Prediabetes, Follow-up  Lab Results  Component Value Date   HGBA1C 5.9 (A) 12/18/2019   HGBA1C 6.0 (H) 06/09/2019   HGBA1C 6.0 (H) 12/15/2018   GLUCOSE 94 06/09/2019   GLUCOSE 96 12/15/2018   GLUCOSE 85 06/08/2018    Last seen for for this6 months ago.  Management since that visit includes no changes. Current symptoms include none and have been stable.  Prior visit with dietician: no Current diet: in general, a "healthy" diet   Current exercise: bicycling and walking  Pertinent Labs:    Component Value Date/Time   CHOL 160 12/15/2018 0916   TRIG 125 12/15/2018 0916   CHOLHDL 3.6 12/15/2018 0916   CREATININE 0.72 06/09/2019 1104   CREATININE 0.60 05/10/2017 1634    Wt Readings from Last 3 Encounters:  12/18/19 216 lb 3.2 oz (98.1 kg)  06/09/19 214 lb 9.6 oz (97.3 kg)  03/15/19 210 lb 6.4 oz (95.4 kg)    ----------------------------------------------------------------------------------------- Hypertension, follow-up  BP Readings from Last 3 Encounters:  12/18/19 101/67  06/09/19 101/69  03/15/19 120/70   Wt Readings from Last 3 Encounters:  12/18/19 216 lb 3.2 oz (98.1 kg)  06/09/19 214 lb 9.6 oz (97.3 kg)  03/15/19 210 lb 6.4 oz (95.4 kg)     She was last seen for hypertension 6 months ago.  BP at that visit was 101/69. Management since that visit includes no  changes.  She reports excellent compliance with treatment. She is not having side effects.  She is following a Regular, Low Sodium diet. She is exercising. She does not smoke.  Use of agents associated with hypertension: none.   Outside blood pressures are not being checked. Symptoms: No chest pain No chest pressure  No palpitations No syncope  No dyspnea No orthopnea  No paroxysmal nocturnal dyspnea No lower extremity edema   Pertinent labs: Lab Results  Component Value Date   CHOL 160 12/15/2018   HDL 45 12/15/2018   LDLCALC 90 12/15/2018   TRIG 125 12/15/2018   CHOLHDL 3.6 12/15/2018   Lab Results  Component Value Date   NA 141 06/09/2019   K 4.2 06/09/2019   CREATININE 0.72 06/09/2019   GFRNONAA 90 06/09/2019   GFRAA 104 06/09/2019   GLUCOSE 94 06/09/2019     The 10-year ASCVD risk score Denman George DC Jr., et al., 2013) is: 7.1%   --------------------------------------------------------------------------------------------------- Lipid/Cholesterol, Follow-up  Last lipid panel Other pertinent labs  Lab Results  Component Value Date   CHOL 160 12/15/2018   HDL 45 12/15/2018   LDLCALC 90 12/15/2018   TRIG 125 12/15/2018   CHOLHDL 3.6 12/15/2018   Lab Results  Component Value Date   ALT 81 (H) 06/09/2019   AST 64 (H) 06/09/2019   PLT 238 11/17/2016   TSH 2.170 06/08/2018     She was last seen for this 1 years ago.  Management since  that visit includes no changes.  She reports excellent compliance with treatment. She is not having side effects.   Symptoms: No chest pain No chest pressure/discomfort  No dyspnea No lower extremity edema  No numbness or tingling of extremity No orthopnea  No palpitations No paroxysmal nocturnal dyspnea  No speech difficulty No syncope   Current diet: in general, a "healthy" diet   Current exercise: bicycling and walking  The 10-year ASCVD risk score Denman George DC Jr., et al., 2013) is:  7.1%  --------------------------------------------------------------------------------------------------- Anxiety, Follow-up  She was last seen for anxiety 6 months ago. Changes made at last visit include no changes.   She reports excellent compliance with treatment. Patient reports she has not had to use medication for several months. Reports that her last refill was about 2 years ago. She reports excellent tolerance of treatment. She is not having side effects.   She feels her anxiety is mild and Improved since last visit.  Symptoms: No chest pain No difficulty concentrating  No dizziness No fatigue  No feelings of losing control No insomnia  No irritable No palpitations  No panic attacks No racing thoughts  No shortness of breath No sweating  No tremors/shakes    GAD-7 Results No flowsheet data found.  PHQ-9 Scores PHQ9 SCORE ONLY 06/09/2019 12/08/2018 05/10/2017  PHQ-9 Total Score 1 0 0    ---------------------------------------------------------------------------------------------------   Patient Active Problem List   Diagnosis Date Noted  . Morbid obesity (HCC) 06/09/2019  . Leg cramps 06/10/2018  . Constipation 05/10/2017  . Hemorrhoids 05/10/2017  . Overactive bladder 05/10/2017  . Anxiety 05/10/2017  . Hyperlipidemia 05/10/2017  . Prediabetes 05/10/2017  . Former smoker 05/10/2017  . Avitaminosis D 05/10/2017  . Essential hypertension 05/10/2017   Social History   Tobacco Use  . Smoking status: Former Smoker    Packs/day: 1.00    Years: 46.00    Pack years: 46.00    Types: Cigarettes    Quit date: 03/03/2018    Years since quitting: 1.7  . Smokeless tobacco: Never Used  . Tobacco comment: started smoking at age 56; has quit smoking for more than 10 years altogther in the past  Vaping Use  . Vaping Use: Never used  Substance Use Topics  . Alcohol use: Yes    Comment: one drink per month; wine  . Drug use: No       Medications: Outpatient  Medications Prior to Visit  Medication Sig  . ALPRAZolam (XANAX) 0.25 MG tablet 1/2 TABLET 2 TO 3 TIMES daily  AS NEEDED  . fenofibrate (TRICOR) 145 MG tablet TAKE 1 TABLET (145 MG TOTAL) DAILY BY MOUTH.  . fexofenadine (ALLEGRA) 180 MG tablet Take by mouth.  Marland Kitchen lisinopril-hydrochlorothiazide (ZESTORETIC) 20-25 MG tablet Take 1 tablet by mouth daily.  . Multiple Vitamins-Minerals (MULTIVITAMIN PO) Take by mouth.  . Omega-3 Fatty Acids (FISH OIL) 1200 MG CAPS Take by mouth.  . TOVIAZ 8 MG TB24 tablet TAKE 1 TABLET (8 MG TOTAL) DAILY BY MOUTH.  . vitamin C (ASCORBIC ACID) 500 MG tablet Take 500 mg daily by mouth.  . Vitamin D, Ergocalciferol, 2000 units CAPS Take by mouth.  . [DISCONTINUED] fluticasone (FLONASE) 50 MCG/ACT nasal spray Place 2 sprays into both nostrils daily.  . [DISCONTINUED] metoprolol succinate (TOPROL-XL) 50 MG 24 hr tablet TAKE 1 TABLET (50 MG TOTAL) BY MOUTH DAILY. TAKE WITH OR IMMEDIATELY FOLLOWING A MEAL.  . [DISCONTINUED] rosuvastatin (CRESTOR) 10 MG tablet TAKE 1 TABLET BY MOUTH EVERY DAY  No facility-administered medications prior to visit.    Review of Systems  Constitutional: Negative.   Eyes: Negative.   Respiratory: Negative.   Cardiovascular: Negative.   Endocrine: Negative.   Psychiatric/Behavioral: Negative.      Objective    BP 101/67 (BP Location: Left Arm, Patient Position: Sitting, Cuff Size: Normal)   Pulse 82   Temp (!) 97.1 F (36.2 C) (Temporal)   Resp 16   Ht 5\' 4"  (1.626 m)   Wt 216 lb 3.2 oz (98.1 kg)   BMI 37.11 kg/m  BP Readings from Last 3 Encounters:  12/18/19 101/67  06/09/19 101/69  03/15/19 120/70   Wt Readings from Last 3 Encounters:  12/18/19 216 lb 3.2 oz (98.1 kg)  06/09/19 214 lb 9.6 oz (97.3 kg)  03/15/19 210 lb 6.4 oz (95.4 kg)      Physical Exam Vitals reviewed.  Constitutional:      General: She is not in acute distress.    Appearance: Normal appearance. She is well-developed. She is not diaphoretic.   HENT:     Head: Normocephalic and atraumatic.  Eyes:     General: No scleral icterus.    Conjunctiva/sclera: Conjunctivae normal.  Neck:     Thyroid: No thyromegaly.  Cardiovascular:     Rate and Rhythm: Normal rate and regular rhythm.     Pulses: Normal pulses.     Heart sounds: Normal heart sounds. No murmur heard.   Pulmonary:     Effort: Pulmonary effort is normal. No respiratory distress.     Breath sounds: Normal breath sounds. No wheezing, rhonchi or rales.  Musculoskeletal:     Cervical back: Neck supple.     Right lower leg: No edema.     Left lower leg: No edema.  Lymphadenopathy:     Cervical: No cervical adenopathy.  Skin:    General: Skin is warm and dry.     Findings: No rash.  Neurological:     Mental Status: She is alert and oriented to person, place, and time. Mental status is at baseline.  Psychiatric:        Mood and Affect: Mood normal.        Behavior: Behavior normal.      Results for orders placed or performed in visit on 12/18/19  POCT glycosylated hemoglobin (Hb A1C)  Result Value Ref Range   Hemoglobin A1C 5.9 (A) 4.0 - 5.6 %   Est. average glucose Bld gHb Est-mCnc 123     Assessment & Plan     Problem List Items Addressed This Visit      Cardiovascular and Mediastinum   Essential hypertension - Primary    Well controlled Continue current medications Recheck metabolic panel F/u in 6 months       Relevant Medications   rosuvastatin (CRESTOR) 10 MG tablet   metoprolol succinate (TOPROL-XL) 50 MG 24 hr tablet   Other Relevant Orders   Comprehensive metabolic panel     Other   Anxiety    Well controlled Continue to use Xanax at current dose      Hyperlipidemia    Previously well controlled Continue Crestor and Fenofibrate Repeat FLP and CMP Follow up in 6 months       Relevant Medications   rosuvastatin (CRESTOR) 10 MG tablet   metoprolol succinate (TOPROL-XL) 50 MG 24 hr tablet   Other Relevant Orders   Lipid Panel  With LDL/HDL Ratio   Prediabetes    Well controlled with last A1c 5.9  On Statin Discussed diet and exercise F/u in 6 months       Relevant Orders   POCT glycosylated hemoglobin (Hb A1C) (Completed)   Morbid obesity ([place])    Discussed importance of healthy weight management Discussed diet and exercise        Other Visit Diagnoses    Environmental allergies       Relevant Medications   fluticasone (FLONASE) 50 MCG/ACT nasal spray   Encounter for screening for HIV       Relevant Orders   HIV Antibody (routine testing w rflx)       Return in about 6 months (around 06/18/2020) for CPE.      I, Lavon Paganini, MD, have reviewed all documentation for this visit. The documentation on 12/19/19 for the exam, diagnosis, procedures, and orders are all accurate and complete.   Micki Cassel, Dionne Bucy, MD, MPH [facility]

## 2019-12-18 ENCOUNTER — Other Ambulatory Visit: Payer: Self-pay

## 2019-12-18 ENCOUNTER — Encounter: Payer: Self-pay | Admitting: Family Medicine

## 2019-12-18 ENCOUNTER — Other Ambulatory Visit: Payer: Self-pay | Admitting: Family Medicine

## 2019-12-18 ENCOUNTER — Ambulatory Visit: Payer: BC Managed Care – PPO | Admitting: Family Medicine

## 2019-12-18 VITALS — BP 101/67 | HR 82 | Temp 97.1°F | Resp 16 | Ht 64.0 in | Wt 216.2 lb

## 2019-12-18 DIAGNOSIS — R7303 Prediabetes: Secondary | ICD-10-CM | POA: Diagnosis not present

## 2019-12-18 DIAGNOSIS — Z9109 Other allergy status, other than to drugs and biological substances: Secondary | ICD-10-CM

## 2019-12-18 DIAGNOSIS — E782 Mixed hyperlipidemia: Secondary | ICD-10-CM | POA: Diagnosis not present

## 2019-12-18 DIAGNOSIS — I10 Essential (primary) hypertension: Secondary | ICD-10-CM | POA: Diagnosis not present

## 2019-12-18 DIAGNOSIS — Z114 Encounter for screening for human immunodeficiency virus [HIV]: Secondary | ICD-10-CM

## 2019-12-18 DIAGNOSIS — F419 Anxiety disorder, unspecified: Secondary | ICD-10-CM | POA: Diagnosis not present

## 2019-12-18 LAB — POCT GLYCOSYLATED HEMOGLOBIN (HGB A1C)
Est. average glucose Bld gHb Est-mCnc: 123
Hemoglobin A1C: 5.9 % — AB (ref 4.0–5.6)

## 2019-12-18 MED ORDER — METOPROLOL SUCCINATE ER 50 MG PO TB24: 25.0000 mg | ORAL_TABLET | Freq: Every day | ORAL | 1 refills | Status: DC

## 2019-12-18 MED ORDER — FLUTICASONE PROPIONATE 50 MCG/ACT NA SUSP: 2.0000 | Freq: Every day | NASAL | 11 refills | Status: DC

## 2019-12-18 MED ORDER — ROSUVASTATIN CALCIUM 10 MG PO TABS: 10.0000 mg | ORAL_TABLET | Freq: Every day | ORAL | 1 refills | Status: DC

## 2019-12-18 NOTE — Assessment & Plan Note (Signed)
Discussed importance of healthy weight management Discussed diet and exercise  

## 2019-12-18 NOTE — Assessment & Plan Note (Signed)
Well controlled Continue current medications Recheck metabolic panel F/u in 6 months  

## 2019-12-18 NOTE — Telephone Encounter (Signed)
Requested Prescriptions  Pending Prescriptions Disp Refills   lisinopril-hydrochlorothiazide (ZESTORETIC) 20-25 MG tablet [Pharmacy Med Name: LISINOPRIL-HCTZ 20-25 MG TAB] 90 tablet 1    Sig: TAKE 1 TABLET BY MOUTH EVERY DAY     Cardiovascular:  ACEI + Diuretic Combos Failed - 12/18/2019  8:09 PM      Failed - Na in normal range and within 180 days    Sodium  Date Value Ref Range Status  06/09/2019 141 134 - 144 mmol/L Final         Failed - K in normal range and within 180 days    Potassium  Date Value Ref Range Status  06/09/2019 4.2 3.5 - 5.2 mmol/L Final         Failed - Cr in normal range and within 180 days    Creat  Date Value Ref Range Status  05/10/2017 0.60 0.50 - 0.99 mg/dL Final    Comment:    For patients >34 years of age, the reference limit for Creatinine is approximately 13% higher for people identified as African-American. .    Creatinine, Ser  Date Value Ref Range Status  06/09/2019 0.72 0.57 - 1.00 mg/dL Final         Failed - Ca in normal range and within 180 days    Calcium  Date Value Ref Range Status  06/09/2019 10.1 8.7 - 10.3 mg/dL Final         Passed - Patient is not pregnant      Passed - Last BP in normal range    BP Readings from Last 1 Encounters:  12/18/19 101/67         Passed - Valid encounter within last 6 months    Recent Outpatient Visits          Today Essential hypertension   [facility] [place], Marzella Schlein, [place]   6 months ago Encounter for annual physical exam   [facility] [place], Marzella Schlein, [place]   9 months ago Dysuria   [facility], [place]   1 year ago Essential hypertension   [facility] [place], Marzella Schlein, [place]   1 year ago Acute non-recurrent pansinusitis   [facility] [place], Alessandra Bevels, [place]      Future Appointments            In 5 months Bacigalupo, Marzella Schlein, MD [facility], [facility]            Patient seen by provider today.  Plan per provider note is to check labs and for patient to continue current medications.  Labs have not been collected yet.

## 2019-12-18 NOTE — Patient Instructions (Signed)

## 2019-12-18 NOTE — Assessment & Plan Note (Signed)
Well controlled Continue to use Xanax at current dose

## 2019-12-18 NOTE — Assessment & Plan Note (Signed)
Well controlled with last A1c 5.9 On Statin Discussed diet and exercise F/u in 6 months

## 2019-12-18 NOTE — Assessment & Plan Note (Addendum)
Previously well controlled Continue Crestor and Fenofibrate Repeat FLP and CMP Follow up in 6 months

## 2019-12-19 ENCOUNTER — Telehealth: Payer: Self-pay

## 2019-12-19 ENCOUNTER — Encounter: Payer: Self-pay | Admitting: Family Medicine

## 2019-12-19 LAB — LIPID PANEL WITH LDL/HDL RATIO
Cholesterol, Total: 153 mg/dL (ref 100–199)
HDL: 45 mg/dL (ref >39)
LDL Chol Calc (NIH): 82 mg/dL (ref 0–99)
LDL/HDL Ratio: 1.8 ratio (ref 0.0–3.2)
Triglycerides: 148 mg/dL (ref 0–149)
VLDL Cholesterol Cal: 26 mg/dL (ref 5–40)

## 2019-12-19 LAB — COMPREHENSIVE METABOLIC PANEL
ALT: 62 IU/L — ABNORMAL HIGH (ref 0–32)
AST: 40 IU/L (ref 0–40)
Albumin/Globulin Ratio: 1.9 (ref 1.2–2.2)
Albumin: 4.6 g/dL (ref 3.8–4.8)
Alkaline Phosphatase: 68 IU/L (ref 48–121)
BUN/Creatinine Ratio: 16 (ref 12–28)
BUN: 14 mg/dL (ref 8–27)
Bilirubin Total: <0.2 mg/dL (ref 0.0–1.2)
CO2: 23 mmol/L (ref 20–29)
Calcium: 10 mg/dL (ref 8.7–10.3)
Chloride: 105 mmol/L (ref 96–106)
Creatinine, Ser: 0.89 mg/dL (ref 0.57–1.00)
GFR calc Af Amer: 80 mL/min/{1.73_m2} (ref >59)
GFR calc non Af Amer: 69 mL/min/{1.73_m2} (ref >59)
Globulin, Total: 2.4 g/dL (ref 1.5–4.5)
Glucose: 132 mg/dL — ABNORMAL HIGH (ref 65–99)
Potassium: 4 mmol/L (ref 3.5–5.2)
Sodium: 142 mmol/L (ref 134–144)
Total Protein: 7 g/dL (ref 6.0–8.5)

## 2019-12-19 LAB — HIV ANTIBODY (ROUTINE TESTING W REFLEX): HIV Screen 4th Generation wRfx: NONREACTIVE

## 2019-12-19 NOTE — Telephone Encounter (Signed)
LMTCB, PEC may give result.  

## 2019-12-19 NOTE — Telephone Encounter (Signed)
Patient called, left VM to return the call to the office for results.  

## 2019-12-19 NOTE — Telephone Encounter (Signed)
-----   Message from Erasmo Downer, MD sent at 12/19/2019  8:42 AM EDT ----- Normal/stable labs

## 2019-12-20 NOTE — Telephone Encounter (Signed)
Please advise 

## 2019-12-20 NOTE — Telephone Encounter (Signed)
Patient notified that her labs are normal. She does have some concern about her elevation in her liver function and wants to know if PCP has nay suggestions about getting that into normal range. She does not consume alcohol- very rare and will increase her fluids and eat more greens.

## 2019-12-20 NOTE — Telephone Encounter (Signed)
Attempted to call pt.  Left vm to return call to the office to receive lab results.  °

## 2019-12-21 NOTE — Telephone Encounter (Signed)
Patient advised and verbalized understanding. Patient agrees to try recommendations stated below.

## 2019-12-21 NOTE — Telephone Encounter (Signed)
This slight elevation is likely due to fatty liver disease.  This is common with metabolic syndrome along with high cholesterol, prediabetes and high blood pressure.  You can get a liver ultrasound to confirm the diagnosis, but not necessary at this stable, mildly elevated level.  Recommend diet low in saturated fat and regular exercise - 30 min at least 5 times per week. Continue to avoid alcohol.

## 2020-04-16 ENCOUNTER — Other Ambulatory Visit: Payer: Self-pay | Admitting: Family Medicine

## 2020-04-16 DIAGNOSIS — Z1231 Encounter for screening mammogram for malignant neoplasm of breast: Secondary | ICD-10-CM

## 2020-05-22 ENCOUNTER — Ambulatory Visit
Admission: RE | Admit: 2020-05-22 | Discharge: 2020-05-22 | Disposition: A | Discharge status: Home / Self Care | Payer: BC Managed Care – PPO | Source: Ambulatory Visit | Attending: Family Medicine | Admitting: Family Medicine

## 2020-05-22 ENCOUNTER — Other Ambulatory Visit: Payer: Self-pay

## 2020-05-22 DIAGNOSIS — Z1231 Encounter for screening mammogram for malignant neoplasm of breast: Secondary | ICD-10-CM | POA: Diagnosis present

## 2020-05-27 ENCOUNTER — Telehealth: Payer: Self-pay

## 2020-05-27 NOTE — Telephone Encounter (Signed)
Pt advised.   Thanks,   -Shimshon Narula  

## 2020-05-27 NOTE — Telephone Encounter (Signed)
-----   Message from Erasmo Downer, MD sent at 05/27/2020  8:17 AM EST ----- Normal mammogram. Repeat in 1 yr

## 2020-06-10 ENCOUNTER — Other Ambulatory Visit: Payer: Self-pay

## 2020-06-10 ENCOUNTER — Encounter: Payer: Self-pay | Admitting: Family Medicine

## 2020-06-10 ENCOUNTER — Ambulatory Visit (INDEPENDENT_AMBULATORY_CARE_PROVIDER_SITE_OTHER): Payer: BC Managed Care – PPO | Admitting: Family Medicine

## 2020-06-10 VITALS — BP 110/73 | HR 74 | Temp 97.9°F | Resp 16 | Ht 64.0 in | Wt 217.0 lb

## 2020-06-10 DIAGNOSIS — Z0000 Encounter for general adult medical examination without abnormal findings: Secondary | ICD-10-CM | POA: Diagnosis not present

## 2020-06-10 DIAGNOSIS — Z23 Encounter for immunization: Secondary | ICD-10-CM | POA: Diagnosis not present

## 2020-06-10 DIAGNOSIS — E782 Mixed hyperlipidemia: Secondary | ICD-10-CM | POA: Diagnosis not present

## 2020-06-10 DIAGNOSIS — F419 Anxiety disorder, unspecified: Secondary | ICD-10-CM

## 2020-06-10 DIAGNOSIS — I10 Essential (primary) hypertension: Secondary | ICD-10-CM

## 2020-06-10 DIAGNOSIS — R7303 Prediabetes: Secondary | ICD-10-CM

## 2020-06-10 NOTE — Patient Instructions (Signed)

## 2020-06-10 NOTE — Assessment & Plan Note (Addendum)
Previously well controlled BP 110/73 Continue current medications Recheck CMP F/u 6 months.
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
## 2020-06-10 NOTE — Assessment & Plan Note (Signed)
Previously well controlled with A1c of 5.9 On statin Has been working on diet and exercise Check A1c and blood sugar. CMP, FLP F/u 6 months

## 2020-06-10 NOTE — Assessment & Plan Note (Signed)
Previously well controlled Continue Crestor and fenofibrate Repeat FLP and CMP F/u 6 months

## 2020-06-10 NOTE — Assessment & Plan Note (Signed)
Well controlled Manifests as 'panic attacks' while in the car Takes xanax prn (once every 2 months) Continue at current dose

## 2020-06-11 LAB — LIPID PANEL
Chol/HDL Ratio: 3.5 ratio (ref 0.0–4.4)
Cholesterol, Total: 163 mg/dL (ref 100–199)
HDL: 46 mg/dL (ref >39)
LDL Chol Calc (NIH): 96 mg/dL (ref 0–99)
Triglycerides: 116 mg/dL (ref 0–149)
VLDL Cholesterol Cal: 21 mg/dL (ref 5–40)

## 2020-06-11 LAB — COMPREHENSIVE METABOLIC PANEL
ALT: 86 IU/L — ABNORMAL HIGH (ref 0–32)
AST: 60 IU/L — ABNORMAL HIGH (ref 0–40)
Albumin/Globulin Ratio: 1.8 (ref 1.2–2.2)
Albumin: 4.5 g/dL (ref 3.8–4.8)
Alkaline Phosphatase: 53 IU/L (ref 44–121)
BUN/Creatinine Ratio: 17 (ref 12–28)
BUN: 15 mg/dL (ref 8–27)
Bilirubin Total: 0.5 mg/dL (ref 0.0–1.2)
CO2: 22 mmol/L (ref 20–29)
Calcium: 10.1 mg/dL (ref 8.7–10.3)
Chloride: 103 mmol/L (ref 96–106)
Creatinine, Ser: 0.87 mg/dL (ref 0.57–1.00)
GFR calc Af Amer: 82 mL/min/{1.73_m2} (ref >59)
GFR calc non Af Amer: 71 mL/min/{1.73_m2} (ref >59)
Globulin, Total: 2.5 g/dL (ref 1.5–4.5)
Glucose: 100 mg/dL — ABNORMAL HIGH (ref 65–99)
Potassium: 4.6 mmol/L (ref 3.5–5.2)
Sodium: 141 mmol/L (ref 134–144)
Total Protein: 7 g/dL (ref 6.0–8.5)

## 2020-06-11 LAB — HEMOGLOBIN A1C
Est. average glucose Bld gHb Est-mCnc: 131 mg/dL
Hgb A1c MFr Bld: 6.2 % — ABNORMAL HIGH (ref 4.8–5.6)

## 2020-06-26 ENCOUNTER — Other Ambulatory Visit: Payer: Self-pay | Admitting: Family Medicine

## 2020-06-26 DIAGNOSIS — E782 Mixed hyperlipidemia: Secondary | ICD-10-CM

## 2020-07-08 ENCOUNTER — Encounter: Payer: Self-pay | Admitting: Family Medicine

## 2020-07-11 NOTE — Telephone Encounter (Signed)
I can't print this from my computer. Can you print it for me and I'll sign it?  Thanks!

## 2020-12-16 ENCOUNTER — Other Ambulatory Visit: Payer: Self-pay | Admitting: Family Medicine

## 2020-12-16 DIAGNOSIS — E782 Mixed hyperlipidemia: Secondary | ICD-10-CM

## 2020-12-17 ENCOUNTER — Encounter: Payer: Self-pay | Admitting: Family Medicine

## 2020-12-20 ENCOUNTER — Other Ambulatory Visit: Payer: Self-pay | Admitting: Family Medicine

## 2020-12-20 DIAGNOSIS — I10 Essential (primary) hypertension: Secondary | ICD-10-CM

## 2021-01-07 ENCOUNTER — Other Ambulatory Visit: Payer: Self-pay

## 2021-01-07 ENCOUNTER — Encounter: Payer: Self-pay | Admitting: Family Medicine

## 2021-01-07 ENCOUNTER — Ambulatory Visit (INDEPENDENT_AMBULATORY_CARE_PROVIDER_SITE_OTHER): Payer: BC Managed Care – PPO | Admitting: Family Medicine

## 2021-01-07 VITALS — BP 127/73 | HR 86 | Resp 16 | Wt 218.6 lb

## 2021-01-07 DIAGNOSIS — I10 Essential (primary) hypertension: Secondary | ICD-10-CM

## 2021-01-07 DIAGNOSIS — R7303 Prediabetes: Secondary | ICD-10-CM | POA: Diagnosis not present

## 2021-01-07 DIAGNOSIS — E782 Mixed hyperlipidemia: Secondary | ICD-10-CM

## 2021-01-07 DIAGNOSIS — E559 Vitamin D deficiency, unspecified: Secondary | ICD-10-CM

## 2021-01-07 DIAGNOSIS — R0981 Nasal congestion: Secondary | ICD-10-CM | POA: Insufficient documentation

## 2021-01-07 DIAGNOSIS — Z1211 Encounter for screening for malignant neoplasm of colon: Secondary | ICD-10-CM

## 2021-01-07 DIAGNOSIS — F411 Generalized anxiety disorder: Secondary | ICD-10-CM

## 2021-01-07 LAB — POCT GLYCOSYLATED HEMOGLOBIN (HGB A1C): Hemoglobin A1C: 6 % — AB (ref 4.0–5.6)

## 2021-01-07 MED ORDER — BUSPIRONE HCL 5 MG PO TABS
5.0000 mg | ORAL_TABLET | Freq: Two times a day (BID) | ORAL | 2 refills | Status: DC
Start: 2021-01-07 — End: 2021-01-30

## 2021-01-07 NOTE — Assessment & Plan Note (Signed)
Chronic and uncontrolled Resume buspar - can further titrate dose as needed Use xanax very sparingly

## 2021-01-07 NOTE — Assessment & Plan Note (Signed)
Previously well controlled Continue statin Repeat FLP and CMP Goal LDL < 100 

## 2021-01-07 NOTE — Assessment & Plan Note (Signed)
Improving  encourage low carb diet

## 2021-01-07 NOTE — Assessment & Plan Note (Signed)
BMI 37 and associated with HTN, HLD, prediabetes Discussed importance of healthy weight management Discussed diet and exercise

## 2021-01-07 NOTE — Assessment & Plan Note (Signed)
Well controlled Continue current medications Recheck metabolic panel F/u in 6 months  

## 2021-01-07 NOTE — Assessment & Plan Note (Signed)
Short-lived non-bacterial Discussed symptomatic management If lasts > 7-10 days, consider bacterial etiology and abx

## 2021-01-07 NOTE — Progress Notes (Signed)
Established patient visit   Patient: Bonnie Duffy   DOB: 02-Aug-1955   65 y.o. Female  MRN: [id number] Visit Date: 01/07/2021  Today's healthcare provider: Shirlee Latch, MD   Chief Complaint  Patient presents with   Hypertension   Hyperlipidemia   Anxiety   Prediabetes   Subjective    Hypertension Associated symptoms include anxiety. Pertinent negatives include no chest pain, headaches, neck pain, palpitations or shortness of breath.  Hyperlipidemia Pertinent negatives include no chest pain, myalgias or shortness of breath.  Anxiety Patient reports no chest pain, dizziness, nausea, palpitations or shortness of breath.     Hypertension, follow-up  BP Readings from Last 3 Encounters:  01/07/21 127/73  06/10/20 110/73  12/18/19 101/67   Wt Readings from Last 3 Encounters:  01/07/21 218 lb 9.6 oz (99.2 kg)  06/10/20 217 lb (98.4 kg)  12/18/19 216 lb 3.2 oz (98.1 kg)     She was last seen for hypertension 6 months ago.  BP at that visit was 110/73. Management since that visit includes none continue medication.  She reports excellent compliance with treatment. She is not having side effects.  She is following a Regular diet. She is exercising. She does not smoke.  Use of agents associated with hypertension: none.   Outside blood pressures are average systolic 110 diastolic 78  Symptoms: No chest pain No chest pressure  No palpitations No syncope  No dyspnea No orthopnea  No paroxysmal nocturnal dyspnea No lower extremity edema   Pertinent labs: Lab Results  Component Value Date   CHOL 163 06/10/2020   HDL 46 06/10/2020   LDLCALC 96 06/10/2020   TRIG 116 06/10/2020   CHOLHDL 3.5 06/10/2020   Lab Results  Component Value Date   NA 141 06/10/2020   K 4.6 06/10/2020   CREATININE 0.87 06/10/2020   GFRNONAA 71 06/10/2020   GFRAA 82 06/10/2020   GLUCOSE 100 (H) 06/10/2020     The 10-year ASCVD risk score Denman George DC Jr., et al., 2013) is:  6.4%   ---------------------------------------------------------------------------------------------------  Prediabetes, Follow-up  Lab Results  Component Value Date   HGBA1C 6.0 (A) 01/07/2021   HGBA1C 6.2 (H) 06/10/2020   HGBA1C 5.9 (A) 12/18/2019   GLUCOSE 100 (H) 06/10/2020   GLUCOSE 132 (H) 12/18/2019   GLUCOSE 94 06/09/2019   Last seen for for this6 months ago.  Management since that visit includes continue diet and exercise. Current symptoms include  none  and have been unchanged.  Prior visit with dietician: yes -  Current diet: well balanced Current exercise: bicycling and yoga  Pertinent Labs:    Component Value Date/Time   CHOL 163 06/10/2020 0941   TRIG 116 06/10/2020 0941   CHOLHDL 3.5 06/10/2020 0941   CREATININE 0.87 06/10/2020 0941   CREATININE 0.60 05/10/2017 1634    Wt Readings from Last 3 Encounters:  01/07/21 218 lb 9.6 oz (99.2 kg)  06/10/20 217 lb (98.4 kg)  12/18/19 216 lb 3.2 oz (98.1 kg)   -----------------------------------------------------------------------------------------  Lipid/Cholesterol, Follow-up  Last lipid panel Other pertinent labs  Lab Results  Component Value Date   CHOL 163 06/10/2020   HDL 46 06/10/2020   LDLCALC 96 06/10/2020   TRIG 116 06/10/2020   CHOLHDL 3.5 06/10/2020   Lab Results  Component Value Date   ALT 86 (H) 06/10/2020   AST 60 (H) 06/10/2020   PLT 238 11/17/2016   TSH 2.170 06/08/2018     She was last seen for  this 6 months ago.  Management since that visit includes none.  She reports excellent compliance with treatment. She is not having side effects.   Symptoms: No chest pain No chest pressure/discomfort  No dyspnea No lower extremity edema  No numbness or tingling of extremity No orthopnea  No palpitations No paroxysmal nocturnal dyspnea  No speech difficulty No syncope   Current diet: well balanced Current exercise: bicycling  The 10-year ASCVD risk score Denman George(Goff DC Jr., et al., 2013)  is: 6.4%  ---------------------------------------------------------------------------------------------------  Anxiety, Follow-up  She was last seen for anxiety 6 months ago. Changes made at last visit include none, continue Xanax PRN.   She reports fair compliance with treatment. She states that she rarely takes Xanax and would like to discuss today changing to Buspar to have a more level control of her symptoms of anxiety. She has used antidepressants and she felt "muddy-minded" and Buspar didn't cause these symptoms. She used to take 7.5 mg of Buspar back in 2016.  Her anxiety has been prevalent since she was 65 yrs old. She is not having side effects.   She feels her anxiety is moderate and Unchanged since last visit.  Symptoms: No chest pain No difficulty concentrating  No dizziness No fatigue  No feelings of losing control No insomnia  No irritable No palpitations  Yes panic attacks No racing thoughts  No shortness of breath No sweating  No tremors/shakes    GAD-7 Results No flowsheet data found.  PHQ-9 Scores PHQ9 SCORE ONLY 01/07/2021 06/10/2020 06/09/2019  PHQ-9 Total Score 0 1 1   Sense Headache She report she has a severe sinus headache with pressure build up that has been present for a few days but has worsened today. Additionally her ears feel stuffy. The sinus headache is causing her teeth to hurt. She takes Flonase and zyrtec everyday to improve symptoms.   ---------------------------------------------------------------------------------------------------  Patient Active Problem List   Diagnosis Date Noted   Sinus congestion 01/07/2021   Morbid obesity (HCC) 06/09/2019   Leg cramps 06/10/2018   Constipation 05/10/2017   Hemorrhoids 05/10/2017   Overactive bladder 05/10/2017   Anxiety 05/10/2017   Hyperlipidemia 05/10/2017   Prediabetes 05/10/2017   Former smoker 05/10/2017   Avitaminosis D 05/10/2017   Essential hypertension 05/10/2017   Past Medical  History:  Diagnosis Date   Allergy    Anxiety    Hyperlipidemia    Hypertension    Allergies  Allergen Reactions   Penicillins     No reactions noted.    Sulfamethoxazole Rash   Medications: Outpatient Medications Prior to Visit  Medication Sig   ALPRAZolam (XANAX) 0.25 MG tablet 1/2 TABLET 2 TO 3 TIMES daily  AS NEEDED   fenofibrate (TRICOR) 145 MG tablet TAKE 1 TABLET (145 MG TOTAL) DAILY BY MOUTH.   fexofenadine (ALLEGRA) 180 MG tablet Take by mouth.   fluticasone (FLONASE) 50 MCG/ACT nasal spray Place 2 sprays into both nostrils daily.   lisinopril-hydrochlorothiazide (ZESTORETIC) 20-25 MG tablet TAKE 1 TABLET BY MOUTH EVERY DAY   metoprolol succinate (TOPROL-XL) 50 MG 24 hr tablet TAKE 0.5 TABLETS (25 MG TOTAL) BY MOUTH DAILY. TAKE WITH OR IMMEDIATELY FOLLOWING A MEAL.   Multiple Vitamins-Minerals (MULTIVITAMIN PO) Take by mouth.   Omega-3 Fatty Acids (FISH OIL) 1200 MG CAPS Take by mouth.   rosuvastatin (CRESTOR) 10 MG tablet TAKE 1 TABLET BY MOUTH EVERY DAY   TOVIAZ 8 MG TB24 tablet TAKE 1 TABLET (8 MG TOTAL) DAILY BY MOUTH.   vitamin  C (ASCORBIC ACID) 500 MG tablet Take 500 mg daily by mouth.   Vitamin D, Ergocalciferol, 2000 units CAPS Take by mouth.   No facility-administered medications prior to visit.   Review of Systems  Constitutional:  Negative for chills, fatigue and fever.  HENT:  Negative for ear pain, sinus pressure, sinus pain and sore throat.   Eyes:  Negative for pain and visual disturbance.  Respiratory:  Negative for cough, chest tightness, shortness of breath and wheezing.   Cardiovascular:  Negative for chest pain, palpitations and leg swelling.  Gastrointestinal:  Negative for abdominal pain, blood in stool, diarrhea, nausea and vomiting.  Genitourinary:  Negative for flank pain, frequency, pelvic pain and urgency.  Musculoskeletal:  Negative for back pain, myalgias and neck pain.  Neurological:  Negative for dizziness, weakness, light-headedness,  numbness and headaches.      Objective    BP 127/73   Pulse 86   Resp 16   Wt 218 lb 9.6 oz (99.2 kg)   SpO2 96%   BMI 37.52 kg/m     Physical Exam Vitals reviewed.  Constitutional:      General: She is not in acute distress.    Appearance: Normal appearance. She is well-developed. She is not diaphoretic.  HENT:     Head: Normocephalic and atraumatic.  Eyes:     General: No scleral icterus.    Conjunctiva/sclera: Conjunctivae normal.  Neck:     Thyroid: No thyromegaly.  Cardiovascular:     Rate and Rhythm: Normal rate and regular rhythm.     Pulses: Normal pulses.     Heart sounds: Normal heart sounds. No murmur heard. Pulmonary:     Effort: Pulmonary effort is normal. No respiratory distress.     Breath sounds: Normal breath sounds. No wheezing, rhonchi or rales.  Musculoskeletal:     Cervical back: Neck supple.     Right lower leg: No edema.     Left lower leg: No edema.  Lymphadenopathy:     Cervical: Cervical adenopathy (left anterior) present.  Skin:    General: Skin is warm and dry.     Findings: No rash.  Neurological:     Mental Status: She is alert and oriented to person, place, and time. Mental status is at baseline.  Psychiatric:        Mood and Affect: Mood normal.        Behavior: Behavior normal.    Results for orders placed or performed in visit on 01/07/21  POCT glycosylated hemoglobin (Hb A1C)  Result Value Ref Range   Hemoglobin A1C 6.0 (A) 4.0 - 5.6 %   HbA1c POC (<> result, manual entry)     HbA1c, POC (prediabetic range)     HbA1c, POC (controlled diabetic range)      Assessment & Plan     Problem List Items Addressed This Visit       Cardiovascular and Mediastinum   Essential hypertension    Well controlled Continue current medications Recheck metabolic panel F/u in 6 months        Relevant Orders   Comprehensive metabolic panel     Respiratory   Sinus congestion    Short-lived non-bacterial Discussed symptomatic  management If lasts > 7-10 days, consider bacterial etiology and abx         Other   Anxiety    Chronic and uncontrolled Resume buspar - can further titrate dose as needed Use xanax very sparingly       Relevant  Medications   busPIRone (BUSPAR) 5 MG tablet   Hyperlipidemia    Previously well controlled Continue statin Repeat FLP and CMP Goal LDL < 100       Relevant Orders   Comprehensive metabolic panel   Lipid panel   Prediabetes - Primary    Improving  encourage low carb diet       Relevant Orders   POCT glycosylated hemoglobin (Hb A1C) (Completed)   Avitaminosis D   Relevant Orders   VITAMIN D 25 Hydroxy (Vit-D Deficiency, Fractures)   Morbid obesity (HCC)    BMI 37 and associated with HTN, HLD, prediabetes Discussed importance of healthy weight management Discussed diet and exercise        Other Visit Diagnoses     Screening for colon cancer       Relevant Orders   Cologuard       Return in about 6 months (around 07/10/2021) for Welcome to Medicare.      I,Essence Turner,acting as a Neurosurgeon for Shirlee Latch, MD.,have documented all relevant documentation on the behalf of Shirlee Latch, MD,as directed by  Shirlee Latch, MD while in the presence of Shirlee Latch, MD.  I, Shirlee Latch, MD, have reviewed all documentation for this visit. The documentation on 01/07/21 for the exam, diagnosis, procedures, and orders are all accurate and complete.   Kendryck Lacroix, Marzella Schlein, MD, MPH [facility]

## 2021-01-09 LAB — COMPREHENSIVE METABOLIC PANEL
ALT: 86 IU/L — ABNORMAL HIGH (ref 0–32)
AST: 49 IU/L — ABNORMAL HIGH (ref 0–40)
Albumin/Globulin Ratio: 2.1 (ref 1.2–2.2)
Albumin: 4.6 g/dL (ref 3.8–4.8)
Alkaline Phosphatase: 64 IU/L (ref 44–121)
BUN/Creatinine Ratio: 19 (ref 12–28)
BUN: 15 mg/dL (ref 8–27)
Bilirubin Total: 0.3 mg/dL (ref 0.0–1.2)
CO2: 21 mmol/L (ref 20–29)
Calcium: 10.1 mg/dL (ref 8.7–10.3)
Chloride: 102 mmol/L (ref 96–106)
Creatinine, Ser: 0.78 mg/dL (ref 0.57–1.00)
Globulin, Total: 2.2 g/dL (ref 1.5–4.5)
Glucose: 108 mg/dL — ABNORMAL HIGH (ref 65–99)
Potassium: 4.5 mmol/L (ref 3.5–5.2)
Sodium: 140 mmol/L (ref 134–144)
Total Protein: 6.8 g/dL (ref 6.0–8.5)
eGFR: 85 mL/min/{1.73_m2} (ref >59)

## 2021-01-09 LAB — LIPID PANEL
Chol/HDL Ratio: 3.6 ratio (ref 0.0–4.4)
Cholesterol, Total: 184 mg/dL (ref 100–199)
HDL: 51 mg/dL (ref >39)
LDL Chol Calc (NIH): 111 mg/dL — ABNORMAL HIGH (ref 0–99)
Triglycerides: 126 mg/dL (ref 0–149)
VLDL Cholesterol Cal: 22 mg/dL (ref 5–40)

## 2021-01-09 LAB — VITAMIN D 25 HYDROXY (VIT D DEFICIENCY, FRACTURES): Vit D, 25-Hydroxy: 44.7 ng/mL (ref 30.0–100.0)

## 2021-01-16 LAB — COLOGUARD: Cologuard: NEGATIVE

## 2021-01-29 ENCOUNTER — Other Ambulatory Visit: Payer: Self-pay | Admitting: Family Medicine

## 2021-01-29 NOTE — Telephone Encounter (Signed)
Requested medication (s) are due for refill today:   Yes  Requested medication (s) are on the active medication list:   Yes  Future visit scheduled:   Yes   Last ordered: 01/07/2021 #60, 2 refills  Returned because pharmacy requesting a 90 day supply   Requested Prescriptions  Pending Prescriptions Disp Refills   busPIRone (BUSPAR) 5 MG tablet [Pharmacy Med Name: BUSPIRONE HCL 5 MG TABLET] 180 tablet 1    Sig: TAKE 1 TABLET BY MOUTH TWICE A DAY      Psychiatry: Anxiolytics/Hypnotics - Non-controlled Passed - 01/29/2021  1:31 PM      Passed - Valid encounter within last 6 months    Recent Outpatient Visits           3 weeks ago Prediabetes   [facility] [place], Marzella Schlein, MD   7 months ago Encounter for annual physical exam   [facility] [place], Marzella Schlein, MD   1 year ago Essential hypertension   [facility] [place], Marzella Schlein, MD   1 year ago Encounter for annual physical exam   [facility] [place], Marzella Schlein, MD   1 year ago Dysuria   [facility] [place]rey [place], [place]       Future Appointments             In 6 months Bacigalupo, Marzella Schlein, MD [facility]

## 2021-03-16 ENCOUNTER — Other Ambulatory Visit: Payer: Self-pay | Admitting: Family Medicine

## 2021-03-16 DIAGNOSIS — Z9109 Other allergy status, other than to drugs and biological substances: Secondary | ICD-10-CM

## 2021-03-17 NOTE — Telephone Encounter (Signed)
Requested medication (s) are due for refill today Yes  Requested medication (s) are on the active medication list Yes  Future visit scheduled Yes  07/29/21  Note to clinic-prescription expired. New prescription needed. Routing to office.   Requested Prescriptions  Pending Prescriptions Disp Refills   fluticasone (FLONASE) 50 MCG/ACT nasal spray [Pharmacy Med Name: FLUTICASONE PROP 50 MCG SPRAY] 48 mL 3    Sig: SPRAY 2 SPRAYS INTO EACH NOSTRIL EVERY DAY     Ear, Nose, and Throat: Nasal Preparations - Corticosteroids Passed - 03/16/2021  9:08 AM      Passed - Valid encounter within last 12 months    Recent Outpatient Visits           2 months ago Prediabetes   [facility] [place], Marzella Schlein, MD   9 months ago Encounter for annual physical exam   [facility] [place], Marzella Schlein, MD   1 year ago Essential hypertension   [facility] [place], Marzella Schlein, MD   1 year ago Encounter for annual physical exam   [facility] [place], Marzella Schlein, MD   2 years ago Dysuria   [facility] [place], [place]       Future Appointments             In 4 months Bacigalupo, Marzella Schlein, MD [facility]

## 2021-03-27 ENCOUNTER — Other Ambulatory Visit: Payer: Self-pay

## 2021-03-27 ENCOUNTER — Ambulatory Visit
Admission: RE | Admit: 2021-03-27 | Discharge: 2021-03-27 | Disposition: A | Discharge status: Home / Self Care | Payer: BC Managed Care – PPO | Source: Ambulatory Visit | Attending: Family | Admitting: Family

## 2021-03-27 VITALS — BP 130/82 | HR 84 | Temp 97.4°F | Resp 18

## 2021-03-27 DIAGNOSIS — R300 Dysuria: Secondary | ICD-10-CM | POA: Diagnosis not present

## 2021-03-27 DIAGNOSIS — R109 Unspecified abdominal pain: Secondary | ICD-10-CM | POA: Diagnosis not present

## 2021-03-27 LAB — POCT URINALYSIS DIP (MANUAL ENTRY)
Bilirubin, UA: NEGATIVE
Blood, UA: NEGATIVE
Glucose, UA: NEGATIVE mg/dL
Ketones, POC UA: NEGATIVE mg/dL
Nitrite, UA: NEGATIVE
Protein Ur, POC: NEGATIVE mg/dL
Spec Grav, UA: 1.01 (ref 1.010–1.025)
Urobilinogen, UA: 0.2 E.U./dL
pH, UA: 7 (ref 5.0–8.0)

## 2021-03-27 MED ORDER — CIPROFLOXACIN HCL 500 MG PO TABS: 500.0000 mg | ORAL_TABLET | Freq: Two times a day (BID) | ORAL | 0 refills | Status: AC

## 2021-03-27 NOTE — Discharge Instructions (Addendum)
Recommend start Cipro 500mg  twice a day as directed. Continue to push fluids. May continue Cranberry juice as needed. Continue Tylenol 1000mg  or Ibuprofen 600mg  every 8 hours as needed for pain. Follow-up pending urine culture results and in 3 days if not improving.

## 2021-03-27 NOTE — ED Triage Notes (Signed)
Pt here with burning on urination, increased urgency, bilateral back pain, and fatigue x 1 week.

## 2021-03-28 LAB — URINE CULTURE
Culture: <10000 — AB
Special Requests: NORMAL

## 2021-03-28 NOTE — ED Provider Notes (Signed)
Bonnie Duffy    CSN: [id number] Arrival date & time: 03/27/21  1231      History   Chief Complaint Chief Complaint  Patient presents with   Dysuria    APPT 12:45    HPI Bonnie Duffy is a 65 y.o. female.   65 year old female presents with pain with urination, increased urinary frequency and urgency and bilateral flank pain for the past week. Has gotten worse in the past 2 to 3 days with right flank pain greater than left. No distinct fever but has felt warm and fatigued. Also nausea but no vomiting or unusual vaginal discharge. Has taken Cranberry juice and Tylenol/Advil with minimal relief. Last UTI about 2 to 3 years ago. Other chronic health issues include HTN, hyperlipidemia, environmental allergies, overactive bladder and anxiety. Currently on Zestoretic, Toprol XL, Tricor, Crestor, Toviaz, Buspar, Allegra and Flonase daily and Xanax prn.   The history is provided by the patient.   Past Medical History:  Diagnosis Date   Allergy    Anxiety    Hyperlipidemia    Hypertension     Patient Active Problem List   Diagnosis Date Noted   Sinus congestion 01/07/2021   Morbid obesity (HCC) 06/09/2019   Leg cramps 06/10/2018   Constipation 05/10/2017   Hemorrhoids 05/10/2017   Overactive bladder 05/10/2017   Anxiety 05/10/2017   Hyperlipidemia 05/10/2017   Prediabetes 05/10/2017   Former smoker 05/10/2017   Avitaminosis D 05/10/2017   Essential hypertension 05/10/2017    Past Surgical History:  Procedure Laterality Date   EYE SURGERY     TONSILLECTOMY  1960   TUBAL LIGATION      OB History   No obstetric history on file.      Home Medications    Prior to Admission medications   Medication Sig Start Date End Date Taking? Authorizing Provider  ciprofloxacin (CIPRO) 500 MG tablet Take 1 tablet (500 mg total) by mouth 2 (two) times daily for 7 days. 03/27/21 04/03/21 Yes Kima Malenfant, Ali Lowe, NP  ALPRAZolam Prudy Feeler) 0.25 MG tablet 1/2 TABLET 2 TO 3  TIMES daily  AS NEEDED 12/04/16   [provider]  busPIRone (BUSPAR) 5 MG tablet TAKE 1 TABLET BY MOUTH TWICE A DAY 01/30/21   Erasmo Downer, MD  fenofibrate (TRICOR) 145 MG tablet TAKE 1 TABLET (145 MG TOTAL) DAILY BY MOUTH. 12/16/20   Bacigalupo, Marzella Schlein, MD  fexofenadine (ALLEGRA) 180 MG tablet Take by mouth.    [provider]  fluticasone (FLONASE) 50 MCG/ACT nasal spray SPRAY 2 SPRAYS INTO EACH NOSTRIL EVERY DAY 03/17/21   Erasmo Downer, MD  lisinopril-hydrochlorothiazide (ZESTORETIC) 20-25 MG tablet TAKE 1 TABLET BY MOUTH EVERY DAY 06/26/20   Bacigalupo, Marzella Schlein, MD  metoprolol succinate (TOPROL-XL) 50 MG 24 hr tablet TAKE 0.5 TABLETS (25 MG TOTAL) BY MOUTH DAILY. TAKE WITH OR IMMEDIATELY FOLLOWING A MEAL. 12/20/20   Erasmo Downer, MD  Multiple Vitamins-Minerals (MULTIVITAMIN PO) Take by mouth.    [provider]  Omega-3 Fatty Acids (FISH OIL) 1200 MG CAPS Take by mouth.    [provider]  rosuvastatin (CRESTOR) 10 MG tablet TAKE 1 TABLET BY MOUTH EVERY DAY 12/16/20   Bacigalupo, Marzella Schlein, MD  TOVIAZ 8 MG TB24 tablet TAKE 1 TABLET (8 MG TOTAL) DAILY BY MOUTH. 12/16/20   Bacigalupo, Marzella Schlein, MD  vitamin C (ASCORBIC ACID) 500 MG tablet Take 500 mg daily by mouth.    [provider]  Vitamin  D, Ergocalciferol, 2000 units CAPS Take by mouth.    [provider]    Family History Family History  Problem Relation Age of Onset   Heart disease Mother    Lung cancer Father    Prostate cancer Father    Heart disease Father        chemo-induced   Heart attack Father    Skin cancer Father    Stroke Sister 91   Healthy Brother    Heart disease Maternal Grandmother    Diabetes Maternal Grandmother    Breast cancer Maternal Grandmother 74   Heart disease Maternal Grandfather    Heart disease Paternal Grandmother    Leukemia Paternal Grandfather    Colon cancer Neg Hx    Ovarian cancer Neg Hx    Cervical cancer Neg Hx      Social History Social History   Tobacco Use   Smoking status: Former    Packs/day: 1.00    Years: 46.00    Pack years: 46.00    Types: Cigarettes    Quit date: 03/03/2018    Years since quitting: 3.0   Smokeless tobacco: Never   Tobacco comments:    started smoking at age 62; has quit smoking for more than 10 years altogther in the past  Vaping Use   Vaping Use: Never used  Substance Use Topics   Alcohol use: Yes    Comment: one drink per month; wine   Drug use: No     Allergies   Penicillins and Sulfamethoxazole   Review of Systems Review of Systems  Constitutional:  Positive for appetite change, chills and fatigue. Negative for activity change, diaphoresis and fever.  Respiratory:  Negative for chest tightness and shortness of breath.   Gastrointestinal:  Positive for nausea. Negative for abdominal pain and vomiting.  Genitourinary:  Positive for decreased urine volume, dysuria, flank pain, frequency and urgency. Negative for genital sores, hematuria, pelvic pain and vaginal discharge.  Musculoskeletal:  Positive for arthralgias and back pain. Negative for neck pain and neck stiffness.  Skin:  Negative for color change and rash.  Allergic/Immunologic: Positive for environmental allergies. Negative for food allergies and immunocompromised state.  Neurological:  Negative for dizziness, tremors, seizures, syncope, weakness and light-headedness.  Hematological:  Negative for adenopathy. Does not bruise/bleed easily.    Physical Exam Triage Vital Signs ED Triage Vitals  Enc Vitals Group     BP 03/27/21 1249 130/82     Pulse Rate 03/27/21 1249 84     Resp 03/27/21 1249 18     Temp 03/27/21 1249 (!) 97.4 F (36.3 C)     Temp Source 03/27/21 1249 Oral     SpO2 03/27/21 1249 97 %     Weight --      Height --      Head Circumference --      Peak Flow --      Pain Score 03/27/21 1302 5     Pain Loc --      Pain Edu? --      Excl. in GC? --    No data  found.  Updated Vital Signs BP 130/82 (BP Location: Left Arm)   Pulse 84   Temp (!) 97.4 F (36.3 C) (Oral)   Resp 18   SpO2 97%   Visual Acuity Right Eye Distance:   Left Eye Distance:   Bilateral Distance:    Right Eye Near:   Left Eye Near:    Bilateral Near:  Physical Exam Vitals and nursing note reviewed.  Constitutional:      General: She is awake. She is not in acute distress.    Appearance: She is well-developed and well-groomed.     Comments: She is sitting on the exam table in no acute distress.   HENT:     Head: Normocephalic and atraumatic.     Right Ear: Hearing normal.     Left Ear: Hearing normal.  Eyes:     Extraocular Movements: Extraocular movements intact.     Conjunctiva/sclera: Conjunctivae normal.  Cardiovascular:     Rate and Rhythm: Normal rate and regular rhythm.     Heart sounds: Normal heart sounds. No murmur heard. Pulmonary:     Effort: Pulmonary effort is normal. No respiratory distress.     Breath sounds: Normal breath sounds and air entry. No decreased air movement. No decreased breath sounds, wheezing or rhonchi.  Abdominal:     General: Bowel sounds are normal. There is no distension.     Palpations: Abdomen is soft.     Tenderness: There is no abdominal tenderness. There is right CVA tenderness and left CVA tenderness. There is no guarding or rebound.  Musculoskeletal:     Cervical back: Normal range of motion.  Skin:    General: Skin is warm and dry.     Capillary Refill: Capillary refill takes less than 2 seconds.     Findings: No rash.  Neurological:     General: No focal deficit present.     Mental Status: She is alert and oriented to person, place, and time.  Psychiatric:        Mood and Affect: Mood normal.        Behavior: Behavior normal. Behavior is cooperative.        Thought Content: Thought content normal.        Judgment: Judgment normal.     UC Treatments / Results  Labs (all labs ordered are listed, but  only abnormal results are displayed) Labs Reviewed  POCT URINALYSIS DIP (MANUAL ENTRY) - Abnormal; Notable for the following components:      Result Value   Leukocytes, UA Trace (*)    All other components within normal limits  URINE CULTURE    EKG   Radiology No results found.  Procedures Procedures (including critical care time)  Medications Ordered in UC Medications - No data to display  Initial Impression / Assessment and Plan / UC Course  I have reviewed the triage vital signs and the nursing notes.  Pertinent labs & imaging results that were available during my care of the patient were reviewed by me and considered in my medical decision making (see chart for details).    Reviewed urinalysis results with patient. Positive WBC's but remainder of results are negative. Patient currently on Weight-Watcher's program and drinks copious amounts of water so specimen may be diluted. Discussed that she may have a UTI with early kidney involvement due to location of pain. Patient allergic to PCN and Sulfa. She is hesitant to take Macrobid. She has taken Cipro in the past without any difficulty. Will start Cipro 500mg  twice a day as directed- side effects and warnings reviewed. Continue to push fluids. May continue Cranberry juice. May continue Tylenol 1000mg  or Ibuprofen 600mg  every 8 hours as needed for pain. If pain gets more severe and she develops a fever and unable to keep fluids down, go to the ER ASAP. Otherwise, follow-up pending urine culture results and in  3 days with her PCP if not improving.   Final Clinical Impressions(s) / UC Diagnoses   Final diagnoses:  Dysuria  Bilateral flank pain     Discharge Instructions      Recommend start Cipro 500mg  twice a day as directed. Continue to push fluids. May continue Cranberry juice as needed. Continue Tylenol 1000mg  or Ibuprofen 600mg  every 8 hours as needed for pain. Follow-up pending urine culture results and in 3 days if not  improving.      ED Prescriptions     Medication Sig Dispense Auth. Provider   ciprofloxacin (CIPRO) 500 MG tablet Take 1 tablet (500 mg total) by mouth 2 (two) times daily for 7 days. 14 tablet Shantinique Picazo, , NP      PDMP not reviewed this encounter.   , NP 03/28/21 [phone]

## 2021-04-08 ENCOUNTER — Encounter: Payer: Self-pay | Admitting: Family Medicine

## 2021-04-27 ENCOUNTER — Other Ambulatory Visit: Payer: Self-pay | Admitting: Family Medicine

## 2021-04-27 DIAGNOSIS — I10 Essential (primary) hypertension: Secondary | ICD-10-CM

## 2021-04-27 NOTE — Telephone Encounter (Signed)
Requested Prescriptions  Pending Prescriptions Disp Refills  . metoprolol succinate (TOPROL-XL) 50 MG 24 hr tablet [Pharmacy Med Name: METOPROLOL SUCC ER 50 MG TAB] 45 tablet 0    Sig: TAKE 0.5 TABLETS (25 MG TOTAL) BY MOUTH DAILY. TAKE WITH OR IMMEDIATELY FOLLOWING A MEAL.     Cardiovascular:  Beta Blockers Passed - 04/27/2021  1:13 PM      Passed - Last BP in normal range    BP Readings from Last 1 Encounters:  03/27/21 130/82         Passed - Last Heart Rate in normal range    Pulse Readings from Last 1 Encounters:  03/27/21 84         Passed - Valid encounter within last 6 months    Recent Outpatient Visits          3 months ago Prediabetes   [facility] [place], Marzella Schlein, MD   10 months ago Encounter for annual physical exam   [facility] [place], Marzella Schlein, MD   1 year ago Essential hypertension   [facility] [place], Marzella Schlein, MD   1 year ago Encounter for annual physical exam   [facility] [place], Marzella Schlein, MD   2 years ago Dysuria   [facility] [place], [place]      Future Appointments            In 3 months Bacigalupo, Marzella Schlein, MD [facility]

## 2021-06-18 ENCOUNTER — Other Ambulatory Visit: Payer: Self-pay | Admitting: Family Medicine

## 2021-06-18 DIAGNOSIS — E782 Mixed hyperlipidemia: Secondary | ICD-10-CM

## 2021-06-18 NOTE — Telephone Encounter (Signed)
Requested Prescriptions  Pending Prescriptions Disp Refills   fesoterodine (TOVIAZ) 8 MG TB24 tablet [Pharmacy Med Name: FESOTERODINE ER 8 MG TABLET] 90 tablet 1    Sig: TAKE 1 TABLET (8 MG TOTAL) DAILY BY MOUTH.     Urology:  Bladder Agents Passed - 06/18/2021  1:11 AM      Passed - Valid encounter within last 12 months    Recent Outpatient Visits          5 months ago Prediabetes   [facility] [place], Marzella Schlein, MD   1 year ago Encounter for annual physical exam   [facility] [place], Marzella Schlein, MD   1 year ago Essential hypertension   [facility] [place], Marzella Schlein, MD   2 years ago Encounter for annual physical exam   [facility] [place], Marzella Schlein, MD   2 years ago Dysuria   [facility] [place], [place]      Future Appointments            In 1 month Bacigalupo, Marzella Schlein, MD [facility]

## 2021-06-18 NOTE — Telephone Encounter (Signed)
Requested Prescriptions  Pending Prescriptions Disp Refills   fenofibrate (TRICOR) 145 MG tablet [Pharmacy Med Name: FENOFIBRATE 145 MG TABLET] 90 tablet 1    Sig: TAKE 1 TABLET (145 MG TOTAL) DAILY BY MOUTH.     Cardiovascular:  Antilipid - Fibric Acid Derivatives Failed - 06/18/2021  9:11 AM      Failed - LDL in normal range and within 360 days    LDL Chol Calc (NIH)  Date Value Ref Range Status  01/08/2021 111 (H) 0 - 99 mg/dL Final         Failed - ALT in normal range and within 180 days    ALT  Date Value Ref Range Status  01/08/2021 86 (H) 0 - 32 IU/L Final         Failed - AST in normal range and within 180 days    AST  Date Value Ref Range Status  01/08/2021 49 (H) 0 - 40 IU/L Final         Passed - Total Cholesterol in normal range and within 360 days    Cholesterol, Total  Date Value Ref Range Status  01/08/2021 184 100 - 199 mg/dL Final         Passed - HDL in normal range and within 360 days    HDL  Date Value Ref Range Status  01/08/2021 51 >39 mg/dL Final         Passed - Triglycerides in normal range and within 360 days    Triglycerides  Date Value Ref Range Status  01/08/2021 126 0 - 149 mg/dL Final         Passed - Cr in normal range and within 180 days    Creat  Date Value Ref Range Status  05/10/2017 0.60 0.50 - 0.99 mg/dL Final    Comment:    For patients >62 years of age, the reference limit for Creatinine is approximately 13% higher for people identified as African-American. .    Creatinine, Ser  Date Value Ref Range Status  01/08/2021 0.78 0.57 - 1.00 mg/dL Final         Passed - eGFR in normal range and within 180 days    GFR, Est African American  Date Value Ref Range Status  05/10/2017 115 > OR = 60 mL/min/1.71m Final   GFR calc Af Amer  Date Value Ref Range Status  06/10/2020 82 >59 mL/min/1.73 Final    Comment:    **In accordance with recommendations from the NKF-ASN Task force,**   [facility] is in the process of  updating its eGFR calculation to the   2021 CKD-EPI creatinine equation that estimates kidney function   without a race variable.    GFR, Est Non African American  Date Value Ref Range Status  05/10/2017 99 > OR = 60 mL/min/1.780mFinal   GFR calc non Af Amer  Date Value Ref Range Status  06/10/2020 71 >59 mL/min/1.73 Final   eGFR  Date Value Ref Range Status  01/08/2021 85 >59 mL/min/1.73 Final         Passed - Valid encounter within last 12 months    Recent Outpatient Visits          5 months ago [facility] [place]   1 year ago [facility] for annual physical exam   [facility]   1 year ago Essential hypertension   [facility]   2  years ago Encounter for annual physical exam   [facility] [place], Dionne Bucy, MD   2 years ago [place], [place], [place]      Future Appointments            In 1 month Bacigalupo, Dionne Bucy, MD [facility]            rosuvastatin (CRESTOR) 10 MG tablet [Pharmacy Med Name: ROSUVASTATIN CALCIUM 10 MG TAB] 90 tablet 1    Sig: TAKE 1 TABLET BY MOUTH EVERY DAY     Cardiovascular:  Antilipid - Statins Failed - 06/18/2021  9:11 AM      Failed - LDL in normal range and within 360 days    LDL Chol Calc (NIH)  Date Value Ref Range Status  01/08/2021 111 (H) 0 - 99 mg/dL Final         Passed - Total Cholesterol in normal range and within 360 days    Cholesterol, Total  Date Value Ref Range Status  01/08/2021 184 100 - 199 mg/dL Final         Passed - HDL in normal range and within 360 days    HDL  Date Value Ref Range Status  01/08/2021 51 >39 mg/dL Final         Passed - Triglycerides in normal range and within 360 days    Triglycerides  Date Value Ref Range Status  01/08/2021 126 0 - 149 mg/dL Final         Passed - Patient is not  pregnant      Passed - Valid encounter within last 12 months    Recent Outpatient Visits          5 months ago Prediabetes   [facility] [place], Dionne Bucy, MD   1 year ago Encounter for annual physical exam   [facility] [place], Dionne Bucy, MD   1 year ago Essential hypertension   [place], Dionne Bucy, MD   2 years ago Encounter for annual physical exam   [facility], Dionne Bucy, MD   2 years ago [place], [place], [place]      Future Appointments            In 1 month Bacigalupo, Dionne Bucy, MD [facility], [place]

## 2021-06-24 ENCOUNTER — Other Ambulatory Visit: Payer: Self-pay | Admitting: Family Medicine

## 2021-06-24 DIAGNOSIS — Z1231 Encounter for screening mammogram for malignant neoplasm of breast: Secondary | ICD-10-CM

## 2021-06-26 ENCOUNTER — Other Ambulatory Visit: Payer: Self-pay | Admitting: Family Medicine

## 2021-06-26 NOTE — Telephone Encounter (Signed)
Requested Prescriptions  Pending Prescriptions Disp Refills   busPIRone (BUSPAR) 5 MG tablet [Pharmacy Med Name: BUSPIRONE HCL 5 MG TABLET] 180 tablet 0    Sig: TAKE 1 TABLET BY MOUTH TWICE A DAY     Psychiatry: Anxiolytics/Hypnotics - Non-controlled Passed - 06/26/2021  8:14 AM      Passed - Valid encounter within last 6 months    Recent Outpatient Visits          5 months ago Prediabetes   [facility] [place], Marzella Schlein, MD   1 year ago Encounter for annual physical exam   [facility] [place], Marzella Schlein, MD   1 year ago Essential hypertension   [facility] [place], Marzella Schlein, MD   2 years ago Encounter for annual physical exam   [facility] [place], Marzella Schlein, MD   2 years ago Dysuria   [facility] [place], [place]      Future Appointments            In 1 month Bacigalupo, Marzella Schlein, MD [facility]

## 2021-07-21 ENCOUNTER — Other Ambulatory Visit: Payer: Self-pay | Admitting: Family Medicine

## 2021-07-21 DIAGNOSIS — I10 Essential (primary) hypertension: Secondary | ICD-10-CM

## 2021-07-28 ENCOUNTER — Encounter: Payer: Self-pay | Admitting: Family Medicine

## 2021-07-29 ENCOUNTER — Ambulatory Visit (INDEPENDENT_AMBULATORY_CARE_PROVIDER_SITE_OTHER): Payer: Medicare PPO | Admitting: Family Medicine

## 2021-07-29 ENCOUNTER — Other Ambulatory Visit (HOSPITAL_COMMUNITY)
Admission: RE | Admit: 2021-07-29 | Discharge: 2021-07-29 | Disposition: A | Discharge status: Home / Self Care | Payer: Medicare PPO | Source: Ambulatory Visit | Attending: Family Medicine | Admitting: Family Medicine

## 2021-07-29 ENCOUNTER — Encounter: Payer: Self-pay | Admitting: Family Medicine

## 2021-07-29 ENCOUNTER — Other Ambulatory Visit: Payer: Self-pay

## 2021-07-29 VITALS — BP 112/66 | HR 67 | Temp 97.7°F | Resp 16 | Ht 64.0 in | Wt 205.6 lb

## 2021-07-29 DIAGNOSIS — Z01419 Encounter for gynecological examination (general) (routine) without abnormal findings: Secondary | ICD-10-CM | POA: Diagnosis present

## 2021-07-29 DIAGNOSIS — Z1151 Encounter for screening for human papillomavirus (HPV): Secondary | ICD-10-CM | POA: Diagnosis not present

## 2021-07-29 DIAGNOSIS — Z23 Encounter for immunization: Secondary | ICD-10-CM | POA: Diagnosis not present

## 2021-07-29 DIAGNOSIS — Z124 Encounter for screening for malignant neoplasm of cervix: Secondary | ICD-10-CM

## 2021-07-29 DIAGNOSIS — Z0000 Encounter for general adult medical examination without abnormal findings: Secondary | ICD-10-CM | POA: Diagnosis not present

## 2021-07-29 DIAGNOSIS — Z780 Asymptomatic menopausal state: Secondary | ICD-10-CM | POA: Diagnosis not present

## 2021-07-29 NOTE — Progress Notes (Signed)
Annual Wellness Visit     Patient: Bonnie RosenthalLaurie Studer Labra, Female    DOB: [birth year], 66 y.o.   MRN: [id number] Visit Date: 07/29/2021  Today's Provider: Shirlee LatchAngela Cinnamon Morency, MD   Chief Complaint  Patient presents with   Welcome to Medicare   Subjective    Bonnie Duffy is a 66 y.o. female who presents today for her Annual Wellness Visit. She reports consuming a  weight watchers  diet. Home exercise routine includes exercise bike, taichi. She generally feels well. She reports sleeping well. She does not have additional problems to discuss today.   HPI    Medications: Outpatient Medications Prior to Visit  Medication Sig   ALPRAZolam (XANAX) 0.25 MG tablet 1/2 TABLET 2 TO 3 TIMES daily  AS NEEDED   busPIRone (BUSPAR) 5 MG tablet TAKE 1 TABLET BY MOUTH TWICE A DAY   fenofibrate (TRICOR) 145 MG tablet TAKE 1 TABLET (145 MG TOTAL) DAILY BY MOUTH.   fesoterodine (TOVIAZ) 8 MG TB24 tablet TAKE 1 TABLET (8 MG TOTAL) DAILY BY MOUTH.   fexofenadine (ALLEGRA) 180 MG tablet Take by mouth.   fluticasone (FLONASE) 50 MCG/ACT nasal spray SPRAY 2 SPRAYS INTO EACH NOSTRIL EVERY DAY   lisinopril-hydrochlorothiazide (ZESTORETIC) 20-25 MG tablet TAKE 1 TABLET BY MOUTH EVERY DAY   metoprolol succinate (TOPROL-XL) 50 MG 24 hr tablet TAKE 0.5 TABLETS (25 MG TOTAL) BY MOUTH DAILY. TAKE WITH OR IMMEDIATELY FOLLOWING A MEAL.   Multiple Vitamins-Minerals (MULTIVITAMIN PO) Take by mouth.   Omega-3 Fatty Acids (FISH OIL) 1200 MG CAPS Take by mouth.   rosuvastatin (CRESTOR) 10 MG tablet TAKE 1 TABLET BY MOUTH EVERY DAY   vitamin C (ASCORBIC ACID) 500 MG tablet Take 500 mg daily by mouth.   Vitamin D, Ergocalciferol, 2000 units CAPS Take by mouth.   No facility-administered medications prior to visit.    Allergies  Allergen Reactions   Penicillins     No reactions noted.    Sulfamethoxazole Rash    Patient Care Team: Erasmo DownerBacigalupo, Shaheen Mende M, MD as PCP - General (Family Medicine)  Review  of Systems  Constitutional: Negative.   HENT:  Positive for tinnitus.   Eyes: Negative.   Respiratory: Negative.    Cardiovascular: Negative.   Gastrointestinal: Negative.   Endocrine: Negative.   Genitourinary: Negative.   Musculoskeletal: Negative.   Allergic/Immunologic: Positive for environmental allergies.  Neurological: Negative.   Hematological: Negative.   Psychiatric/Behavioral: Negative.    All other systems reviewed and are negative.      Objective    Vitals: BP 112/66 (BP Location: Left Arm, Patient Position: Sitting, Cuff Size: Large)    Pulse 67    Temp 97.7 F (36.5 C) (Oral)    Resp 16    Ht 5\' 4"  (1.626 m)    Wt 205 lb 9.6 oz (93.3 kg)    BMI 35.29 kg/m    Physical Exam Vitals reviewed.  Constitutional:      General: She is not in acute distress.    Appearance: Normal appearance. She is well-developed. She is not diaphoretic.  HENT:     Head: Normocephalic and atraumatic.     Right Ear: Tympanic membrane, ear canal and external ear normal.     Left Ear: Tympanic membrane, ear canal and external ear normal.     Nose: Nose normal.     Mouth/Throat:     Mouth: Mucous membranes are moist.     Pharynx: Oropharynx is clear. No oropharyngeal exudate.  Eyes:  General: No scleral icterus.    Conjunctiva/sclera: Conjunctivae normal.     Pupils: Pupils are equal, round, and reactive to light.  Neck:     Thyroid: No thyromegaly.  Cardiovascular:     Rate and Rhythm: Normal rate and regular rhythm.     Pulses: Normal pulses.     Heart sounds: Normal heart sounds. No murmur heard. Pulmonary:     Effort: Pulmonary effort is normal. No respiratory distress.     Breath sounds: Normal breath sounds. No wheezing or rales.  Chest:     Comments: Breasts: breasts appear normal, no suspicious masses, no skin or nipple changes or axillary nodes  Abdominal:     General: There is no distension.     Palpations: Abdomen is soft.     Tenderness: There is no abdominal  tenderness.  Genitourinary:    Comments: GYN:  External genitalia within normal limits.  Vaginal mucosa pink, moist, normal rugae.  Nonfriable cervix without lesions, no discharge or bleeding noted on speculum exam.   Musculoskeletal:        General: No deformity.     Cervical back: Neck supple.     Right lower leg: No edema.     Left lower leg: No edema.  Lymphadenopathy:     Cervical: No cervical adenopathy.  Skin:    General: Skin is warm and dry.     Findings: No rash.  Neurological:     Mental Status: She is alert and oriented to person, place, and time. Mental status is at baseline.     Sensory: No sensory deficit.     Motor: No weakness.     Gait: Gait normal.  Psychiatric:        Mood and Affect: Mood normal.        Behavior: Behavior normal.        Thought Content: Thought content normal.     Most recent functional status assessment: In your present state of health, do you have any difficulty performing the following activities: 07/29/2021  Hearing? N  Vision? N  Difficulty concentrating or making decisions? N  Walking or climbing stairs? N  Dressing or bathing? N  Doing errands, shopping? N  Some recent data might be hidden   Most recent fall risk assessment: Fall Risk  07/29/2021  Falls in the past year? 0  Number falls in past yr: 0  Injury with Fall? 0  Follow up -    Most recent depression screenings: PHQ 2/9 Scores 07/29/2021 01/07/2021  PHQ - 2 Score 0 0  PHQ- 9 Score - 0   Most recent cognitive screening: 6CIT Screen 07/29/2021  What Year? 0 points  What month? 0 points  What time? 0 points  Count back from 20 0 points  Months in reverse 0 points  Repeat phrase 2 points  Total Score 2   Hearing Screening   500Hz  1000Hz  2000Hz  4000Hz  5000Hz   Right ear 25 25 25  40 25  Left ear 25 25 25  40 25  Vision Screening - Comments:: Had eye exam 01/2021-goes yearly [facility] on   Most recent Audit-C alcohol use screening Alcohol Use  Disorder Test (AUDIT) 07/29/2021  1. How often do you have a drink containing alcohol? 1  2. How many drinks containing alcohol do you have on a typical day when you are drinking? 0  3. How often do you have six or more drinks on one occasion? 0  AUDIT-C Score 1  Alcohol Brief  Interventions/Follow-up -   A score of 3 or more in women, and 4 or more in men indicates increased risk for alcohol abuse, EXCEPT if all of the points are from question 1   No results found for any visits on 07/29/21.  Assessment & Plan     Annual wellness visit done today including the all of the following: Reviewed patient's Family Medical History Reviewed and updated list of patient's medical providers Assessment of cognitive impairment was done Assessed patient's functional ability Established a written schedule for health screening services Health Risk Assessent Completed and Reviewed  Exercise Activities and Dietary recommendations  Goals   None     Immunization History  Administered Date(s) Administered   Hepatitis B 10/03/1992, 11/04/1992, 04/24/1993   Influenza Inj Mdck Quad Pf 03/18/2021   Influenza,inj,Quad PF,6+ Mos 03/29/2017, 03/02/2018, 03/15/2020   Influenza,inj,quad, With Preservative 03/10/2010   Influenza-Unspecified 03/10/2019   PFIZER(Purple Top)SARS-COV-2 Vaccination 09/19/2019, 10/10/2019, 06/10/2020   Pfizer Covid-19 Vaccine Bivalent Booster 67yrs & up 03/18/2021   Pneumococcal Polysaccharide-23 03/13/2012   Td 06/09/2019   Tdap 10/17/2008   Zoster Recombinat (Shingrix) 12/15/2018, 06/09/2019    Health Maintenance  Topic Date Due   Pneumonia Vaccine 17+ Years old (2 - PCV) 03/13/2013   DEXA SCAN  Never done   PAP SMEAR-Modifier  11/18/2021   MAMMOGRAM  05/22/2022   Fecal DNA (Cologuard)  01/14/2024   TETANUS/TDAP  06/08/2029   INFLUENZA VACCINE  Completed   COVID-19 Vaccine  Completed   Hepatitis C Screening  Completed   HIV Screening  Completed   Zoster Vaccines-  Shingrix  Completed   HPV VACCINES  Aged Out     Discussed health benefits of physical activity, and encouraged her to engage in regular exercise appropriate for her age and condition.    Problem List Items Addressed This Visit   None Visit Diagnoses     Welcome to Medicare preventive visit    -  Primary   Relevant Orders   EKG 12-Lead (Completed)   Postmenopausal       Relevant Orders   DG Bone Density   Need for pneumococcal vaccine       Relevant Orders   Pneumococcal conjugate vaccine 20-valent (Prevnar 20)   Cervical cancer screening       Relevant Orders   Cytology - PAP        Return in about 6 months (around 01/26/2022) for chronic disease f/u.     I, Shirlee Latch, MD, have reviewed all documentation for this visit. The documentation on 07/29/21 for the exam, diagnosis, procedures, and orders are all accurate and complete.   Abdulahad Mederos, Marzella Schlein, MD, MPH [facility]

## 2021-07-31 LAB — CYTOLOGY - PAP
Adequacy: ABSENT
Comment: NEGATIVE
Diagnosis: NEGATIVE
High risk HPV: NEGATIVE

## 2021-08-08 ENCOUNTER — Other Ambulatory Visit: Payer: Self-pay

## 2021-08-08 ENCOUNTER — Ambulatory Visit
Admission: RE | Admit: 2021-08-08 | Discharge: 2021-08-08 | Disposition: A | Discharge status: Home / Self Care | Payer: Medicare PPO | Source: Ambulatory Visit | Attending: Family Medicine | Admitting: Family Medicine

## 2021-08-08 DIAGNOSIS — Z1231 Encounter for screening mammogram for malignant neoplasm of breast: Secondary | ICD-10-CM | POA: Insufficient documentation

## 2021-09-10 ENCOUNTER — Other Ambulatory Visit: Payer: Self-pay

## 2021-09-10 ENCOUNTER — Ambulatory Visit
Admission: RE | Admit: 2021-09-10 | Discharge: 2021-09-10 | Disposition: A | Discharge status: Home / Self Care | Payer: Medicare PPO | Source: Ambulatory Visit | Attending: Family Medicine | Admitting: Family Medicine

## 2021-09-10 ENCOUNTER — Other Ambulatory Visit: Payer: Medicare PPO

## 2021-09-10 DIAGNOSIS — Z780 Asymptomatic menopausal state: Secondary | ICD-10-CM | POA: Insufficient documentation

## 2021-09-11 ENCOUNTER — Encounter: Payer: Self-pay | Admitting: Family Medicine

## 2021-09-20 ENCOUNTER — Other Ambulatory Visit: Payer: Self-pay | Admitting: Family Medicine

## 2021-10-13 DIAGNOSIS — M5412 Radiculopathy, cervical region: Secondary | ICD-10-CM | POA: Diagnosis not present

## 2021-10-13 DIAGNOSIS — M62830 Muscle spasm of back: Secondary | ICD-10-CM | POA: Diagnosis not present

## 2021-10-13 DIAGNOSIS — M9902 Segmental and somatic dysfunction of thoracic region: Secondary | ICD-10-CM | POA: Diagnosis not present

## 2021-10-13 DIAGNOSIS — M9901 Segmental and somatic dysfunction of cervical region: Secondary | ICD-10-CM | POA: Diagnosis not present

## 2021-10-19 ENCOUNTER — Other Ambulatory Visit: Payer: Self-pay | Admitting: Family Medicine

## 2021-10-19 DIAGNOSIS — I10 Essential (primary) hypertension: Secondary | ICD-10-CM

## 2021-10-27 NOTE — Progress Notes (Signed)
? ? ?  I,Sha'taria Tyson,acting as a Neurosurgeon for [employer], PA-C.,have documented all relevant documentation on the behalf of Alfredia Ferguson, PA-C,as directed by  Alfredia Ferguson, PA-C while in the presence of Alfredia Ferguson, PA-C. ? ?Acute Office Visit ? ?Subjective:  ? ?  ?Patient ID: Bonnie Duffy, female    DOB: 1956/05/24, 66 y.o.   MRN: [id number] ? ?Cc. Nasal congestion, sore throat x 2 weeks ? ?Sore Throat  ?This is a new problem. The current episode started 1 to 4 weeks ago. Neither side of throat is experiencing more pain than the other. There has been no fever. The pain is at a severity of 7/10. Associated symptoms include congestion, coughing and headaches. Associated symptoms comments: fatigue. She has had no exposure to strep. The treatment provided mild relief.  She has been taking Mucinex multisystem relief with some improvement, she thought she felt better about 3 days ago but then her symptoms returned.  She takes an antihistamine every day and uses Flonase. ? ? ?Review of Systems  ?HENT:  Positive for congestion.   ?Respiratory:  Positive for cough.   ?Neurological:  Positive for headaches.  ? ? ?   ?Objective:  ?Blood pressure 130/83, pulse 76, temperature 98.4 ?F (36.9 ?C), height 5' 3.5" (1.613 m), weight 203 lb 9.6 oz (92.4 kg), SpO2 98 %.  ? ?Physical Exam ?Constitutional:   ?   General: She is awake.  ?   Appearance: She is well-developed.  ?HENT:  ?   Head: Normocephalic.  ?   Right Ear: Tympanic membrane normal.  ?   Left Ear: Tympanic membrane normal.  ?   Nose: Congestion and rhinorrhea present.  ?   Mouth/Throat:  ?   Pharynx: Posterior oropharyngeal erythema present. No oropharyngeal exudate.  ?Eyes:  ?   Conjunctiva/sclera: Conjunctivae normal.  ?Neck:  ?   Thyroid: No thyromegaly.  ?   Comments: Right superficial cervical lymph node significantly enlarged, 5 to 6 cm, very firm and immobile.  Slightly tender. ?Cardiovascular:  ?   Rate and Rhythm: Normal rate and regular  rhythm.  ?   Heart sounds: Normal heart sounds.  ?Pulmonary:  ?   Effort: Pulmonary effort is normal.  ?   Breath sounds: Normal breath sounds.  ?Lymphadenopathy:  ?   Cervical: Cervical adenopathy present.  ?Skin: ?   General: Skin is warm.  ?Neurological:  ?   Mental Status: She is alert and oriented to person, place, and time.  ?Psychiatric:     ?   Attention and Perception: Attention normal.     ?   Mood and Affect: Mood normal.     ?   Speech: Speech normal.     ?   Behavior: Behavior is cooperative.  ? ? ?No results found for any visits on 10/28/21. ? ? ?   ?Assessment & Plan:  ? ?Acute sinusitis ?Rx doxycycline 100 mg BID x 7 days ?Advised flonase, antihistamine, mucinex, increase fluids, tylenol  ? ?2. Cervical adenopathy ?Pt states s/p benign biopsy, unable to find records. ? ?I, Alfredia Ferguson, PA-C have reviewed all documentation for this visit. The documentation on  10/28/2021 for the exam, diagnosis, procedures, and orders are all accurate and complete. ? ?Alfredia Ferguson, PA-C ?[facility] ?[address] ?[place], [place], [address] ?Office: [phone] ?Fax: [phone] ? ? ? ?

## 2021-10-28 ENCOUNTER — Ambulatory Visit: Payer: Medicare PPO | Admitting: Physician Assistant

## 2021-10-28 ENCOUNTER — Encounter: Payer: Self-pay | Admitting: Physician Assistant

## 2021-10-28 VITALS — BP 130/83 | HR 76 | Temp 98.4°F | Ht 63.5 in | Wt 203.6 lb

## 2021-10-28 DIAGNOSIS — R590 Localized enlarged lymph nodes: Secondary | ICD-10-CM

## 2021-10-28 DIAGNOSIS — J0110 Acute frontal sinusitis, unspecified: Secondary | ICD-10-CM

## 2021-10-28 MED ORDER — DOXYCYCLINE HYCLATE 100 MG PO TABS: 100.0000 mg | ORAL_TABLET | Freq: Two times a day (BID) | ORAL | 0 refills | Status: AC

## 2021-11-10 DIAGNOSIS — M5412 Radiculopathy, cervical region: Secondary | ICD-10-CM | POA: Diagnosis not present

## 2021-11-10 DIAGNOSIS — M9902 Segmental and somatic dysfunction of thoracic region: Secondary | ICD-10-CM | POA: Diagnosis not present

## 2021-11-10 DIAGNOSIS — M62830 Muscle spasm of back: Secondary | ICD-10-CM | POA: Diagnosis not present

## 2021-11-10 DIAGNOSIS — M9901 Segmental and somatic dysfunction of cervical region: Secondary | ICD-10-CM | POA: Diagnosis not present

## 2021-12-08 DIAGNOSIS — M62830 Muscle spasm of back: Secondary | ICD-10-CM | POA: Diagnosis not present

## 2021-12-08 DIAGNOSIS — M9902 Segmental and somatic dysfunction of thoracic region: Secondary | ICD-10-CM | POA: Diagnosis not present

## 2021-12-08 DIAGNOSIS — M5412 Radiculopathy, cervical region: Secondary | ICD-10-CM | POA: Diagnosis not present

## 2021-12-08 DIAGNOSIS — M9901 Segmental and somatic dysfunction of cervical region: Secondary | ICD-10-CM | POA: Diagnosis not present

## 2021-12-18 ENCOUNTER — Other Ambulatory Visit: Payer: Self-pay | Admitting: Family Medicine

## 2021-12-18 DIAGNOSIS — E782 Mixed hyperlipidemia: Secondary | ICD-10-CM

## 2022-01-06 DIAGNOSIS — M9902 Segmental and somatic dysfunction of thoracic region: Secondary | ICD-10-CM | POA: Diagnosis not present

## 2022-01-06 DIAGNOSIS — M62830 Muscle spasm of back: Secondary | ICD-10-CM | POA: Diagnosis not present

## 2022-01-06 DIAGNOSIS — M9901 Segmental and somatic dysfunction of cervical region: Secondary | ICD-10-CM | POA: Diagnosis not present

## 2022-01-06 DIAGNOSIS — M5412 Radiculopathy, cervical region: Secondary | ICD-10-CM | POA: Diagnosis not present

## 2022-01-16 ENCOUNTER — Other Ambulatory Visit: Payer: Self-pay | Admitting: Family Medicine

## 2022-01-16 DIAGNOSIS — I10 Essential (primary) hypertension: Secondary | ICD-10-CM

## 2022-01-18 ENCOUNTER — Other Ambulatory Visit: Payer: Self-pay | Admitting: Family Medicine

## 2022-01-28 NOTE — Progress Notes (Signed)
I,[name],acting as a Education administrator for [employer], PA-C.,have documented all relevant documentation on the behalf of Bonnie Speak, PA-C,as directed by  [employer], PA-C while in the presence of [employer], PA-C.    Established patient visit   Patient: Bonnie Duffy   DOB: 03/03/1956   66 y.o. Female  MRN: [id number] Visit Date: 01/29/2022  Today's healthcare provider: [name], PA-C   Chief Complaint  Patient presents with   Hypertension   Anxiety   Subjective    Hypertension, follow-up  BP Readings from Last 3 Encounters:  01/29/22 117/77  10/28/21 130/83  07/29/21 112/66   Wt Readings from Last 3 Encounters:  01/29/22 206 lb 1.6 oz (93.5 kg)  10/28/21 203 lb 9.6 oz (92.4 kg)  07/29/21 205 lb 9.6 oz (93.3 kg)     She was last seen for hypertension 6 months ago.  BP at that visit was 112/66. Management since that visit includes continue same treatment. She reports excellent compliance with treatment. She is not having side effects.  She is exercising. She is adherent to low salt diet.   Outside blood pressures are: taking occasionally / normal per pt  She does not smoke. Quit 4 yrs ago.   Use of agents associated with hypertension: none.   --------------------------------------------------------------------------------------------------- Last metabolic panel Lab Results  Component Value Date   GLUCOSE 108 (H) 01/08/2021   NA 140 01/08/2021   K 4.5 01/08/2021   BUN 15 01/08/2021   CREATININE 0.78 01/08/2021   EGFR 85 01/08/2021   GFRNONAA 71 06/10/2020   CALCIUM 10.1 01/08/2021   AST 49 (H) 01/08/2021   ALT 86 (H) 01/08/2021   The 10-year ASCVD risk score (Arnett DK, et al., 2019) is: 6.2%  ---------------------------------------------------------------------------------------------------  Prediabetes, Follow-up  Lab Results  Component Value Date   HGBA1C 6.0 (A) 01/07/2021   HGBA1C 6.2 (H) 06/10/2020   HGBA1C 5.9 (A)  12/18/2019   GLUCOSE 108 (H) 01/08/2021   GLUCOSE 100 (H) 06/10/2020   GLUCOSE 132 (H) 12/18/2019    Last seen for for this6 months ago.  Management since that visit includes no changes/last A1c 6.2 on 06-10-22.Recommend low carb diet and exercise. Current symptoms include none and have been stable. Pertinent Labs: -----------------------------------------------------------------------------------------  Anxiety, Follow-up  She was last seen for anxiety 6 months ago. Changes made at last visit include no changes.  She reports good compliance with treatment. She reports excellent tolerance of treatment. She is not having side effect. She feels her anxiety is mild and Unchanged since last visit.    01/29/2022    9:08 AM  GAD 7 : Generalized Anxiety Score  Nervous, Anxious, on Edge 0  Control/stop worrying 0  Worry too much - different things 0  Trouble relaxing 0  Restless 0  Easily annoyed or irritable 0  Afraid - awful might happen 0  Total GAD 7 Score 0  Anxiety Difficulty Not difficult at all        PHQ-9 Scores    07/29/2021   10:05 AM 01/07/2021    3:47 PM 06/10/2020    8:41 AM  PHQ9 SCORE ONLY  PHQ-9 Total Score 0 0 1    ---------------------------------------------------------------------------------------------------  Medications: Outpatient Medications Prior to Visit  Medication Sig   ALPRAZolam (XANAX) 0.25 MG tablet 1/2 TABLET 2 TO 3 TIMES daily  AS NEEDED   busPIRone (BUSPAR) 5 MG tablet TAKE 1 TABLET BY MOUTH TWICE A DAY   fenofibrate (TRICOR) 145 MG tablet TAKE  1 TABLET (145 MG TOTAL) DAILY BY MOUTH.   fesoterodine (TOVIAZ) 8 MG TB24 tablet TAKE 1 TABLET (8 MG TOTAL) DAILY BY MOUTH.   fexofenadine (ALLEGRA) 180 MG tablet Take by mouth.   fluticasone (FLONASE) 50 MCG/ACT nasal spray SPRAY 2 SPRAYS INTO EACH NOSTRIL EVERY DAY   lisinopril-hydrochlorothiazide (ZESTORETIC) 20-25 MG tablet TAKE 1 TABLET BY MOUTH EVERY DAY   magnesium 30 MG tablet Take by  mouth 1 day or 1 dose.   metoprolol succinate (TOPROL-XL) 50 MG 24 hr tablet TAKE 0.5 TABLETS (25 MG TOTAL) BY MOUTH DAILY. TAKE WITH OR IMMEDIATELY FOLLOWING A MEAL.   Multiple Vitamins-Minerals (MULTIVITAMIN PO) Take by mouth.   Omega-3 Fatty Acids (FISH OIL) 1200 MG CAPS Take by mouth.   rosuvastatin (CRESTOR) 10 MG tablet TAKE 1 TABLET BY MOUTH EVERY DAY   vitamin C (ASCORBIC ACID) 500 MG tablet Take 500 mg daily by mouth.   Vitamin D, Ergocalciferol, 2000 units CAPS Take by mouth.   No facility-administered medications prior to visit.    Review of Systems  All other systems reviewed and are negative.  See hpi    Objective    BP 117/77 (BP Location: Left Arm, Patient Position: Sitting, Cuff Size: Normal)   Pulse 64   Temp 97.8 F (36.6 C) (Oral)   Resp 16   Wt 206 lb 1.6 oz (93.5 kg)   SpO2 98%   BMI 35.94 kg/m    Physical Exam Vitals reviewed.  Constitutional:      General: She is not in acute distress.    Appearance: Normal appearance. She is well-developed. She is not diaphoretic.  HENT:     Head: Normocephalic and atraumatic.  Eyes:     General: No scleral icterus.    Conjunctiva/sclera: Conjunctivae normal.  Neck:     Thyroid: No thyromegaly.  Cardiovascular:     Rate and Rhythm: Normal rate and regular rhythm.     Pulses: Normal pulses.     Heart sounds: Normal heart sounds. No murmur heard. Pulmonary:     Effort: Pulmonary effort is normal. No respiratory distress.     Breath sounds: Normal breath sounds. No wheezing, rhonchi or rales.  Musculoskeletal:     Cervical back: Neck supple.     Right lower leg: No edema.     Left lower leg: No edema.  Lymphadenopathy:     Cervical: No cervical adenopathy.  Skin:    General: Skin is warm and dry.     Findings: No rash.  Neurological:     Mental Status: She is alert and oriented to person, place, and time. Mental status is at baseline.  Psychiatric:        Mood and Affect: Mood normal.        Behavior:  Behavior normal.       No results found for any visits on 01/29/22.  Assessment & Plan      1. Prediabetes  - CBC with Differential/Platelet - Comprehensive metabolic panel - Lipid panel - Hemoglobin A1c  2. Morbid obesity ([place])  - CBC with Differential/Platelet - Comprehensive metabolic panel - Lipid panel - Hemoglobin A1c  3. Anxiety    01/29/2022    9:08 AM  GAD 7 : Generalized Anxiety Score  Nervous, Anxious, on Edge 0  Control/stop worrying 0  Worry too much - different things 0  Trouble relaxing 0  Restless 0  Easily annoyed or irritable 0  Afraid - awful might happen 0  Total GAD 7  Score 0  Anxiety Difficulty Not difficult at all   Chronic and stable Continue current med regimen   4. Former smoker  5. Mixed hyperlipidemia  - Lipid panel  6. Essential hypertension BP 117/77 stable, at goal - CBC with Differential/Platelet - Comprehensive metabolic panel Continue current medications Adherence to low-salt diet advised  7. Elevated liver enzymes - Comprehensive metabolic panel FU in 6 mo with Dr. Jacinto Reap  Obesity Lifestyle modification were discussed.  .The patient was advised to call back or seek an in-person evaluation if the symptoms worsen or if the condition fails to improve as anticipated.  I discussed the assessment and treatment plan with the patient. The patient was provided an opportunity to ask questions and all were answered. The patient agreed with the plan and demonstrated an understanding of the instructions.  The entirety of the information documented in the History of Present Illness, Review of Systems and Physical Exam were personally obtained by me. Portions of this information were initially documented by the CMA and reviewed by me for thoroughness and accuracy. Silvestre Moment    Bonnie Duffy, [place] [phone] (phone) [phone] (fax)  [place]

## 2022-01-29 ENCOUNTER — Ambulatory Visit: Payer: Medicare PPO | Admitting: Physician Assistant

## 2022-01-29 ENCOUNTER — Encounter: Payer: Self-pay | Admitting: Physician Assistant

## 2022-01-29 ENCOUNTER — Ambulatory Visit: Payer: Medicare PPO | Admitting: Family Medicine

## 2022-01-29 VITALS — BP 117/77 | HR 64 | Temp 97.8°F | Resp 16 | Wt 206.1 lb

## 2022-01-29 DIAGNOSIS — E782 Mixed hyperlipidemia: Secondary | ICD-10-CM | POA: Diagnosis not present

## 2022-01-29 DIAGNOSIS — I10 Essential (primary) hypertension: Secondary | ICD-10-CM | POA: Diagnosis not present

## 2022-01-29 DIAGNOSIS — R748 Abnormal levels of other serum enzymes: Secondary | ICD-10-CM | POA: Diagnosis not present

## 2022-01-29 DIAGNOSIS — F419 Anxiety disorder, unspecified: Secondary | ICD-10-CM

## 2022-01-29 DIAGNOSIS — Z87891 Personal history of nicotine dependence: Secondary | ICD-10-CM | POA: Diagnosis not present

## 2022-01-29 DIAGNOSIS — N3281 Overactive bladder: Secondary | ICD-10-CM

## 2022-01-29 DIAGNOSIS — R7303 Prediabetes: Secondary | ICD-10-CM

## 2022-01-30 LAB — COMPREHENSIVE METABOLIC PANEL
ALT: 40 IU/L — ABNORMAL HIGH (ref 0–32)
AST: 24 IU/L (ref 0–40)
Albumin/Globulin Ratio: 2.1 (ref 1.2–2.2)
Albumin: 4.6 g/dL (ref 3.9–4.9)
Alkaline Phosphatase: 52 IU/L (ref 44–121)
BUN/Creatinine Ratio: 25 (ref 12–28)
BUN: 20 mg/dL (ref 8–27)
Bilirubin Total: 0.3 mg/dL (ref 0.0–1.2)
CO2: 22 mmol/L (ref 20–29)
Calcium: 9.9 mg/dL (ref 8.7–10.3)
Chloride: 102 mmol/L (ref 96–106)
Creatinine, Ser: 0.8 mg/dL (ref 0.57–1.00)
Globulin, Total: 2.2 g/dL (ref 1.5–4.5)
Glucose: 98 mg/dL (ref 70–99)
Potassium: 4.7 mmol/L (ref 3.5–5.2)
Sodium: 139 mmol/L (ref 134–144)
Total Protein: 6.8 g/dL (ref 6.0–8.5)
eGFR: 82 mL/min/{1.73_m2} (ref >59)

## 2022-01-30 LAB — CBC WITH DIFFERENTIAL/PLATELET
Basophils Absolute: 0 10*3/uL (ref 0.0–0.2)
Basos: 1 %
EOS (ABSOLUTE): 0.1 10*3/uL (ref 0.0–0.4)
Eos: 2 %
Hematocrit: 45.3 % (ref 34.0–46.6)
Hemoglobin: 14.8 g/dL (ref 11.1–15.9)
Immature Grans (Abs): 0 10*3/uL (ref 0.0–0.1)
Immature Granulocytes: 0 %
Lymphocytes Absolute: 1.9 10*3/uL (ref 0.7–3.1)
Lymphs: 33 %
MCH: 26.2 pg — ABNORMAL LOW (ref 26.6–33.0)
MCHC: 32.7 g/dL (ref 31.5–35.7)
MCV: 80 fL (ref 79–97)
Monocytes Absolute: 0.4 10*3/uL (ref 0.1–0.9)
Monocytes: 7 %
Neutrophils Absolute: 3.4 10*3/uL (ref 1.4–7.0)
Neutrophils: 57 %
Platelets: 221 10*3/uL (ref 150–450)
RBC: 5.64 x10E6/uL — ABNORMAL HIGH (ref 3.77–5.28)
RDW: 12.5 % (ref 11.7–15.4)
WBC: 5.9 10*3/uL (ref 3.4–10.8)

## 2022-01-30 LAB — HEMOGLOBIN A1C
Est. average glucose Bld gHb Est-mCnc: 120 mg/dL
Hgb A1c MFr Bld: 5.8 % — ABNORMAL HIGH (ref 4.8–5.6)

## 2022-01-30 LAB — LIPID PANEL
Chol/HDL Ratio: 3.7 ratio (ref 0.0–4.4)
Cholesterol, Total: 176 mg/dL (ref 100–199)
HDL: 48 mg/dL (ref >39)
LDL Chol Calc (NIH): 108 mg/dL — ABNORMAL HIGH (ref 0–99)
Triglycerides: 111 mg/dL (ref 0–149)
VLDL Cholesterol Cal: 20 mg/dL (ref 5–40)

## 2022-01-30 NOTE — Progress Notes (Signed)
Hello Bonnie Duffy ,   Your labwork results all are within normal limits/stable for you.  No changes need to be made to medications, and no further tests need to be ordered.  Any questions please reach out to the office or message me on MyChart!  Best, Debera Lat, PA-C

## 2022-02-04 DIAGNOSIS — M5412 Radiculopathy, cervical region: Secondary | ICD-10-CM | POA: Diagnosis not present

## 2022-02-04 DIAGNOSIS — M62830 Muscle spasm of back: Secondary | ICD-10-CM | POA: Diagnosis not present

## 2022-02-04 DIAGNOSIS — M9901 Segmental and somatic dysfunction of cervical region: Secondary | ICD-10-CM | POA: Diagnosis not present

## 2022-02-04 DIAGNOSIS — M9902 Segmental and somatic dysfunction of thoracic region: Secondary | ICD-10-CM | POA: Diagnosis not present

## 2022-02-25 ENCOUNTER — Telehealth: Payer: Self-pay | Admitting: Physician Assistant

## 2022-02-25 ENCOUNTER — Encounter: Payer: Self-pay | Admitting: Physician Assistant

## 2022-02-25 NOTE — Telephone Encounter (Signed)
Please, schedule pt for flu clinic and let her know if she wants to do her 5th covid shot/booster , pt could do it in [facility] or [facility]

## 2022-02-26 NOTE — Telephone Encounter (Signed)
Pt advised.

## 2022-02-26 NOTE — Telephone Encounter (Signed)
Pt informed.  Wants to know if you recommend the RSV vaccine?

## 2022-03-10 ENCOUNTER — Encounter: Payer: Self-pay | Admitting: Physician Assistant

## 2022-03-10 ENCOUNTER — Telehealth: Payer: Medicare PPO | Admitting: Physician Assistant

## 2022-03-10 DIAGNOSIS — U071 COVID-19: Secondary | ICD-10-CM

## 2022-03-10 MED ORDER — BENZONATATE 100 MG PO CAPS: 100.0000 mg | ORAL_CAPSULE | Freq: Three times a day (TID) | ORAL | 0 refills | Status: DC | PRN

## 2022-03-10 NOTE — Patient Instructions (Addendum)
Bonnie Duffy, thank you for joining Leeanne Rio, PA-C for today's virtual visit.  While this provider is not your primary care provider (PCP), if your PCP is located in our provider database this encounter information will be shared with them immediately following your visit.  Consent: (Patient) Bonnie Duffy provided verbal consent for this virtual visit at the beginning of the encounter.  Current Medications:  Current Outpatient Medications:    ALPRAZolam (XANAX) 0.25 MG tablet, 1/2 TABLET 2 TO 3 TIMES daily  AS NEEDED, Disp: , Rfl:    busPIRone (BUSPAR) 5 MG tablet, TAKE 1 TABLET BY MOUTH TWICE A DAY, Disp: 180 tablet, Rfl: 0   fenofibrate (TRICOR) 145 MG tablet, TAKE 1 TABLET (145 MG TOTAL) DAILY BY MOUTH., Disp: 90 tablet, Rfl: 1   fesoterodine (TOVIAZ) 8 MG TB24 tablet, TAKE 1 TABLET (8 MG TOTAL) DAILY BY MOUTH., Disp: 90 tablet, Rfl: 1   fexofenadine (ALLEGRA) 180 MG tablet, Take by mouth., Disp: , Rfl:    fluticasone (FLONASE) 50 MCG/ACT nasal spray, SPRAY 2 SPRAYS INTO EACH NOSTRIL EVERY DAY, Disp: 48 mL, Rfl: 3   lisinopril-hydrochlorothiazide (ZESTORETIC) 20-25 MG tablet, TAKE 1 TABLET BY MOUTH EVERY DAY, Disp: 90 tablet, Rfl: 1   magnesium 30 MG tablet, Take by mouth 1 day or 1 dose., Disp: , Rfl:    metoprolol succinate (TOPROL-XL) 50 MG 24 hr tablet, TAKE 0.5 TABLETS (25 MG TOTAL) BY MOUTH DAILY. TAKE WITH OR IMMEDIATELY FOLLOWING A MEAL., Disp: 45 tablet, Rfl: 0   Multiple Vitamins-Minerals (MULTIVITAMIN PO), Take by mouth., Disp: , Rfl:    Omega-3 Fatty Acids (FISH OIL) 1200 MG CAPS, Take by mouth., Disp: , Rfl:    rosuvastatin (CRESTOR) 10 MG tablet, TAKE 1 TABLET BY MOUTH EVERY DAY, Disp: 90 tablet, Rfl: 1   vitamin C (ASCORBIC ACID) 500 MG tablet, Take 500 mg daily by mouth., Disp: , Rfl:    Vitamin D, Ergocalciferol, 2000 units CAPS, Take by mouth., Disp: , Rfl:    Medications ordered in this encounter:  No orders of the defined types were placed  in this encounter.    *If you need refills on other medications prior to your next appointment, please contact your pharmacy*  Follow-Up: Call back or seek an in-person evaluation if the symptoms worsen or if the condition fails to improve as anticipated.  Other Instructions Please keep well-hydrated and get plenty of rest. Start a saline nasal rinse to flush out your nasal passages. You can use plain Mucinex to help thin congestion. If you have a humidifier, running in the bedroom at night. I want you to start OTC vitamin D3 1000 units daily, vitamin C 1000 mg daily, and a zinc supplement. Please take prescribed medications as directed.  Make sure to refrain from taking your Rosuvastatin (crestor) while on the Paxlovid and for 5 additional days after completing it.  You have been enrolled in a MyChart symptom monitoring program. Please answer these questions daily so we can keep track of how you are doing.  You were to quarantine for 5 days from onset of your symptoms.  After day 5, if you have had no fever and you are feeling better, you can end quarantine but need to mask for an additional 5 days. After day 5 if you have a fever or are having significant symptoms, please quarantine for full 10 days.  If you note any worsening of symptoms, any significant shortness of breath or any chest pain, please seek  ER evaluation ASAP.  Please do not delay care!  COVID-19: What to Do if You Are Sick If you test positive and are an older adult or someone who is at high risk of getting very sick from COVID-19, treatment may be available. Contact a healthcare provider right away after a positive test to determine if you are eligible, even if your symptoms are mild right now. You can also visit a Test to Treat location and, if eligible, receive a prescription from a provider. Don't delay: Treatment must be started within the first few days to be effective. If you have a fever, cough, or other  symptoms, you might have COVID-19. Most people have mild illness and are able to recover at home. If you are sick: Keep track of your symptoms. If you have an emergency warning sign (including trouble breathing), call 911. Steps to help prevent the spread of COVID-19 if you are sick If you are sick with COVID-19 or think you might have COVID-19, follow the steps below to care for yourself and to help protect other people in your home and community. Stay home except to get medical care Stay home. Most people with COVID-19 have mild illness and can recover at home without medical care. Do not leave your home, except to get medical care. Do not visit public areas and do not go to places where you are unable to wear a mask. Take care of yourself. Get rest and stay hydrated. Take over-the-counter medicines, such as acetaminophen, to help you feel better. Stay in touch with your doctor. Call before you get medical care. Be sure to get care if you have trouble breathing, or have any other emergency warning signs, or if you think it is an emergency. Avoid public transportation, ride-sharing, or taxis if possible. Get tested If you have symptoms of COVID-19, get tested. While waiting for test results, stay away from others, including staying apart from those living in your household. Get tested as soon as possible after your symptoms start. Treatments may be available for people with COVID-19 who are at risk for becoming very sick. Don't delay: Treatment must be started early to be effective--some treatments must begin within 5 days of your first symptoms. Contact your healthcare provider right away if your test result is positive to determine if you are eligible. Self-tests are one of several options for testing for the virus that causes COVID-19 and may be more convenient than laboratory-based tests and point-of-care tests. Ask your healthcare provider or your local health department if you need help  interpreting your test results. You can visit your state, tribal, local, and territorial health department's website to look for the latest local information on testing sites. Separate yourself from other people As much as possible, stay in a specific room and away from other people and pets in your home. If possible, you should use a separate bathroom. If you need to be around other people or animals in or outside of the home, wear a well-fitting mask. Tell your close contacts that they may have been exposed to COVID-19. An infected person can spread COVID-19 starting 48 hours (or 2 days) before the person has any symptoms or tests positive. By letting your close contacts know they may have been exposed to COVID-19, you are helping to protect everyone. See COVID-19 and Animals if you have questions about pets. If you are diagnosed with COVID-19, someone from the health department may call you. Answer the call to slow the spread.  Monitor your symptoms Symptoms of COVID-19 include fever, cough, or other symptoms. Follow care instructions from your healthcare provider and local health department. Your local health authorities may give instructions on checking your symptoms and reporting information. When to seek emergency medical attention Look for emergency warning signs* for COVID-19. If someone is showing any of these signs, seek emergency medical care immediately: Trouble breathing Persistent pain or pressure in the chest New confusion Inability to wake or stay awake Pale, gray, or blue-colored skin, lips, or nail beds, depending on skin tone *This list is not all possible symptoms. Please call your medical provider for any other symptoms that are severe or concerning to you. Call 911 or call ahead to your local emergency facility: Notify the operator that you are seeking care for someone who has or may have COVID-19. Call ahead before visiting your doctor Call ahead. Many medical visits for  routine care are being postponed or done by phone or telemedicine. If you have a medical appointment that cannot be postponed, call your doctor's office, and tell them you have or may have COVID-19. This will help the office protect themselves and other patients. If you are sick, wear a well-fitting mask You should wear a mask if you must be around other people or animals, including pets (even at home). Wear a mask with the best fit, protection, and comfort for you. You don't need to wear the mask if you are alone. If you can't put on a mask (because of trouble breathing, for example), cover your coughs and sneezes in some other way. Try to stay at least 6 feet away from other people. This will help protect the people around you. Masks should not be placed on young children under age 56 years, anyone who has trouble breathing, or anyone who is not able to remove the mask without help. Cover your coughs and sneezes Cover your mouth and nose with a tissue when you cough or sneeze. Throw away used tissues in a lined trash can. Immediately wash your hands with soap and water for at least 20 seconds. If soap and water are not available, clean your hands with an alcohol-based hand sanitizer that contains at least 60% alcohol. Clean your hands often Wash your hands often with soap and water for at least 20 seconds. This is especially important after blowing your nose, coughing, or sneezing; going to the bathroom; and before eating or preparing food. Use hand sanitizer if soap and water are not available. Use an alcohol-based hand sanitizer with at least 60% alcohol, covering all surfaces of your hands and rubbing them together until they feel dry. Soap and water are the best option, especially if hands are visibly dirty. Avoid touching your eyes, nose, and mouth with unwashed hands. Handwashing Tips Avoid sharing personal household items Do not share dishes, drinking glasses, cups, eating utensils, towels,  or bedding with other people in your home. Wash these items thoroughly after using them with soap and water or put in the dishwasher. Clean surfaces in your home regularly Clean and disinfect high-touch surfaces (for example, doorknobs, tables, handles, light switches, and countertops) in your "sick room" and bathroom. In shared spaces, you should clean and disinfect surfaces and items after each use by the person who is ill. If you are sick and cannot clean, a caregiver or other person should only clean and disinfect the area around you (such as your bedroom and bathroom) on an as needed basis. Your caregiver/other person should wait  as long as possible (at least several hours) and wear a mask before entering, cleaning, and disinfecting shared spaces that you use. Clean and disinfect areas that may have blood, stool, or body fluids on them. Use household cleaners and disinfectants. Clean visible dirty surfaces with household cleaners containing soap or detergent. Then, use a household disinfectant. Use a product from [employer] List N: Disinfectants for Coronavirus (COVID-19). Be sure to follow the instructions on the label to ensure safe and effective use of the product. Many products recommend keeping the surface wet with a disinfectant for a certain period of time (look at "contact time" on the product label). You may also need to wear personal protective equipment, such as gloves, depending on the directions on the product label. Immediately after disinfecting, wash your hands with soap and water for 20 seconds. For completed guidance on cleaning and disinfecting your home, visit Complete Disinfection Guidance. Take steps to improve ventilation at home Improve ventilation (air flow) at home to help prevent from spreading COVID-19 to other people in your household. Clear out COVID-19 virus particles in the air by opening windows, using air filters, and turning on fans in your home. Use this interactive  tool to learn how to improve air flow in your home. When you can be around others after being sick with COVID-19 Deciding when you can be around others is different for different situations. Find out when you can safely end home isolation. For any additional questions about your care, contact your healthcare provider or state or local health department. 09/17/2020 Content source: [facility] ([facility]), Division of Viral Diseases This information is not intended to replace advice given to you by your health care provider. Make sure you discuss any questions you have with your health care provider. Document Revised: 10/31/2020 Document Reviewed: 10/31/2020 Elsevier Patient Education  2022 [employer].      If you have been instructed to have an in-person evaluation today at a local Urgent Care facility, please use the link below. It will take you to a list of all of our available [facility], including address, phone number and hours of operation. Please do not delay care.  [facility]  If you or a family member do not have a primary care provider, use the link below to schedule a visit and establish care. When you choose a [facility] primary care physician or advanced practice provider, you gain a long-term partner in health. Find a Primary Care Provider  Learn more about [facility]'s in-office and virtual care options: [facility] - Get Care Now

## 2022-03-10 NOTE — Progress Notes (Signed)
Virtual Visit Consent   Bonnie Duffy, you are scheduled for a virtual visit with a [facility] provider today. Just as with appointments in the office, your consent must be obtained to participate. Your consent will be active for this visit and any virtual visit you may have with one of our providers in the next 365 days. If you have a MyChart account, a copy of this consent can be sent to you electronically.  As this is a virtual visit, video technology does not allow for your provider to perform a traditional examination. This may limit your provider's ability to fully assess your condition. If your provider identifies any concerns that need to be evaluated in person or the need to arrange testing (such as labs, EKG, etc.), we will make arrangements to do so. Although advances in technology are sophisticated, we cannot ensure that it will always work on either your end or our end. If the connection with a video visit is poor, the visit may have to be switched to a telephone visit. With either a video or telephone visit, we are not always able to ensure that we have a secure connection.  By engaging in this virtual visit, you consent to the provision of healthcare and authorize for your insurance to be billed (if applicable) for the services provided during this visit. Depending on your insurance coverage, you may receive a charge related to this service.  I need to obtain your verbal consent now. Are you willing to proceed with your visit today? Bonnie Duffy has provided verbal consent on 03/10/2022 for a virtual visit (video or telephone). Bonnie Duffy, [place]  Date: 03/10/2022 7:35 PM  Virtual Visit via Video Note   I, Bonnie Duffy, connected with  Bonnie Duffy  ([id number], 18-Sep-1955) on 03/10/22 at  7:30 PM EDT by a video-enabled telemedicine application and verified that I am speaking with the correct person using two identifiers.  Location: Patient:  Virtual Visit Location Patient: Home Provider: Virtual Visit Location Provider: Home Office   I discussed the limitations of evaluation and management by telemedicine and the availability of in person appointments. The patient expressed understanding and agreed to proceed.    History of Present Illness: Bonnie Duffy is a 66 y.o. who identifies as a female who was assigned female at birth, and is being seen today for COVID-19. Notes symptoms starting yesterday into today with chills, sweats, head congestion and fatigue. Denies fever, chest pain or SOB. Some ear pressure. Was on a cruise and just got back to town today. As such she tested as soon as she got home and was positive for COVID. Now notes some loose stool.  HPI: HPI  Problems:  Patient Active Problem List   Diagnosis Date Noted   Sinus congestion 01/07/2021   Morbid obesity (HCC) 06/09/2019   Leg cramps 06/10/2018   Constipation 05/10/2017   Hemorrhoids 05/10/2017   Overactive bladder 05/10/2017   Anxiety 05/10/2017   Hyperlipidemia 05/10/2017   Prediabetes 05/10/2017   Former smoker 05/10/2017   Avitaminosis D 05/10/2017   Essential hypertension 05/10/2017    Allergies:  Allergies  Allergen Reactions   Penicillins     No reactions noted.    Sulfamethoxazole Rash   Medications:  Current Outpatient Medications:    benzonatate (TESSALON) 100 MG capsule, Take 1 capsule (100 mg total) by mouth 3 (three) times daily as needed for cough., Disp: 30 capsule, Rfl: 0   ALPRAZolam (XANAX) 0.25 MG tablet,  1/2 TABLET 2 TO 3 TIMES daily  AS NEEDED, Disp: , Rfl:    busPIRone (BUSPAR) 5 MG tablet, TAKE 1 TABLET BY MOUTH TWICE A DAY, Disp: 180 tablet, Rfl: 0   fenofibrate (TRICOR) 145 MG tablet, TAKE 1 TABLET (145 MG TOTAL) DAILY BY MOUTH., Disp: 90 tablet, Rfl: 1   fesoterodine (TOVIAZ) 8 MG TB24 tablet, TAKE 1 TABLET (8 MG TOTAL) DAILY BY MOUTH., Disp: 90 tablet, Rfl: 1   fexofenadine (ALLEGRA) 180 MG tablet, Take by mouth.,  Disp: , Rfl:    fluticasone (FLONASE) 50 MCG/ACT nasal spray, SPRAY 2 SPRAYS INTO EACH NOSTRIL EVERY DAY, Disp: 48 mL, Rfl: 3   lisinopril-hydrochlorothiazide (ZESTORETIC) 20-25 MG tablet, TAKE 1 TABLET BY MOUTH EVERY DAY, Disp: 90 tablet, Rfl: 1   magnesium 30 MG tablet, Take by mouth 1 day or 1 dose., Disp: , Rfl:    metoprolol succinate (TOPROL-XL) 50 MG 24 hr tablet, TAKE 0.5 TABLETS (25 MG TOTAL) BY MOUTH DAILY. TAKE WITH OR IMMEDIATELY FOLLOWING A MEAL., Disp: 45 tablet, Rfl: 0   Multiple Vitamins-Minerals (MULTIVITAMIN PO), Take by mouth., Disp: , Rfl:    Omega-3 Fatty Acids (FISH OIL) 1200 MG CAPS, Take by mouth., Disp: , Rfl:    rosuvastatin (CRESTOR) 10 MG tablet, TAKE 1 TABLET BY MOUTH EVERY DAY, Disp: 90 tablet, Rfl: 1   vitamin C (ASCORBIC ACID) 500 MG tablet, Take 500 mg daily by mouth., Disp: , Rfl:    Vitamin D, Ergocalciferol, 2000 units CAPS, Take by mouth., Disp: , Rfl:   Observations/Objective: Patient is well-developed, well-nourished in no acute distress.  Resting comfortably at home.  Head is normocephalic, atraumatic.  No labored breathing. Speech is clear and coherent with logical content.  Patient is alert and oriented at baseline.   Assessment and Plan: 1. COVID-19 - MyChart COVID-19 home monitoring program; Future - benzonatate (TESSALON) 100 MG capsule; Take 1 capsule (100 mg total) by mouth 3 (three) times daily as needed for cough.  Dispense: 30 capsule; Refill: 0  Patient with multiple risk factors for complicated course of illness. Discussed risks/benefits of antiviral medications including most common potential ADRs. Patient voiced understanding and would like to proceed with antiviral medication. They are candidate for Paxlovid with recent normal Creatinine and GFR at 82. Rx sent to pharmacy. Supportive measures, OTC medications and vitamin regimen reviewed. Tessalon per order. Patient has been enrolled in a MyChart COVID symptom monitoring program.  Anne Shutter reviewed in detail. Strict ER precautions discussed with patient.    Follow Up Instructions: I discussed the assessment and treatment plan with the patient. The patient was provided an opportunity to ask questions and all were answered. The patient agreed with the plan and demonstrated an understanding of the instructions.  A copy of instructions were sent to the patient via MyChart unless otherwise noted below.   The patient was advised to call back or seek an in-person evaluation if the symptoms worsen or if the condition fails to improve as anticipated.  Time:  I spent 8 minutes with the patient via telehealth technology discussing the above problems/concerns.    Bonnie Climes, PA-C

## 2022-03-11 MED ORDER — NIRMATRELVIR/RITONAVIR (PAXLOVID)TABLET: 3.0000 | ORAL_TABLET | Freq: Two times a day (BID) | ORAL | 0 refills | Status: AC

## 2022-03-12 ENCOUNTER — Telehealth: Payer: Self-pay

## 2022-03-12 NOTE — Telephone Encounter (Signed)
Called, spoke with patient about diarrhea symptoms noted from the COVID questionnaire. Advise patient form my chart companion. Patient verb understanding.

## 2022-03-23 ENCOUNTER — Telehealth: Payer: Self-pay

## 2022-03-23 NOTE — Telephone Encounter (Signed)
Called patient in regards to the [place] questions filled out on my chart for worse symptoms of weakness. No answer, left vm to call back if she had any questions or concerns.  Was going to advise patient of care advise from my chart companion :If patient has worsening weakness with inability to stand or if patient has to hold on to something to get balance, advise patient to call 911 and seek treatment in ED.

## 2022-04-01 DIAGNOSIS — M9902 Segmental and somatic dysfunction of thoracic region: Secondary | ICD-10-CM | POA: Diagnosis not present

## 2022-04-01 DIAGNOSIS — M9901 Segmental and somatic dysfunction of cervical region: Secondary | ICD-10-CM | POA: Diagnosis not present

## 2022-04-01 DIAGNOSIS — M62830 Muscle spasm of back: Secondary | ICD-10-CM | POA: Diagnosis not present

## 2022-04-01 DIAGNOSIS — M5412 Radiculopathy, cervical region: Secondary | ICD-10-CM | POA: Diagnosis not present

## 2022-04-10 ENCOUNTER — Encounter: Payer: Self-pay | Admitting: Family Medicine

## 2022-04-13 ENCOUNTER — Other Ambulatory Visit: Payer: Self-pay | Admitting: Family Medicine

## 2022-04-13 DIAGNOSIS — I10 Essential (primary) hypertension: Secondary | ICD-10-CM

## 2022-04-17 ENCOUNTER — Other Ambulatory Visit: Payer: Self-pay | Admitting: Family Medicine

## 2022-04-17 NOTE — Telephone Encounter (Signed)
Requested Prescriptions  Pending Prescriptions Disp Refills  . lisinopril-hydrochlorothiazide (ZESTORETIC) 20-25 MG tablet [Pharmacy Med Name: LISINOPRIL-HCTZ 20-25 MG TAB] 90 tablet 0    Sig: TAKE 1 TABLET BY MOUTH EVERY DAY     Cardiovascular:  ACEI + Diuretic Combos Passed - 04/17/2022  1:29 AM      Passed - Na in normal range and within 180 days    Sodium  Date Value Ref Range Status  01/29/2022 139 134 - 144 mmol/L Final         Passed - K in normal range and within 180 days    Potassium  Date Value Ref Range Status  01/29/2022 4.7 3.5 - 5.2 mmol/L Final         Passed - Cr in normal range and within 180 days    Creat  Date Value Ref Range Status  05/10/2017 0.60 0.50 - 0.99 mg/dL Final    Comment:    For patients >69 years of age, the reference limit for Creatinine is approximately 13% higher for people identified as African-American. .    Creatinine, Ser  Date Value Ref Range Status  01/29/2022 0.80 0.57 - 1.00 mg/dL Final         Passed - eGFR is 30 or above and within 180 days    GFR, Est African American  Date Value Ref Range Status  05/10/2017 115 > OR = 60 mL/min/1.29m Final   GFR calc Af Amer  Date Value Ref Range Status  06/10/2020 82 >59 mL/min/1.73 Final    Comment:    **In accordance with recommendations from the NKF-ASN Task force,**   [facility] is in the process of updating its eGFR calculation to the   2021 CKD-EPI creatinine equation that estimates kidney function   without a race variable.    GFR, Est Non African American  Date Value Ref Range Status  05/10/2017 99 > OR = 60 mL/min/1.797mFinal   GFR calc non Af Amer  Date Value Ref Range Status  06/10/2020 71 >59 mL/min/1.73 Final   eGFR  Date Value Ref Range Status  01/29/2022 82 >59 mL/min/1.73 Final         Passed - Patient is not pregnant      Passed - Last BP in normal range    BP Readings from Last 1 Encounters:  01/29/22 117/77         Passed - Valid encounter within  last 6 months    Recent Outpatient Visits          2 months ago [facility]   5 months ago Acute non-recurrent frontal sinusitis   [facility]   8 months ago [facility]   [facility] BucyMD   1 year ago Prediabetes   [facility]   1 year ago Encounter for annual physical exam   [facility] An[facility] BucyMD      Future Appointments            In 3 months Bacigalupo, AnDionne BucyMD [facility]

## 2022-04-29 DIAGNOSIS — M5412 Radiculopathy, cervical region: Secondary | ICD-10-CM | POA: Diagnosis not present

## 2022-04-29 DIAGNOSIS — M9902 Segmental and somatic dysfunction of thoracic region: Secondary | ICD-10-CM | POA: Diagnosis not present

## 2022-04-29 DIAGNOSIS — M62830 Muscle spasm of back: Secondary | ICD-10-CM | POA: Diagnosis not present

## 2022-04-29 DIAGNOSIS — M9901 Segmental and somatic dysfunction of cervical region: Secondary | ICD-10-CM | POA: Diagnosis not present

## 2022-05-27 DIAGNOSIS — M9902 Segmental and somatic dysfunction of thoracic region: Secondary | ICD-10-CM | POA: Diagnosis not present

## 2022-05-27 DIAGNOSIS — M9901 Segmental and somatic dysfunction of cervical region: Secondary | ICD-10-CM | POA: Diagnosis not present

## 2022-05-27 DIAGNOSIS — M62830 Muscle spasm of back: Secondary | ICD-10-CM | POA: Diagnosis not present

## 2022-05-27 DIAGNOSIS — M5412 Radiculopathy, cervical region: Secondary | ICD-10-CM | POA: Diagnosis not present

## 2022-06-25 ENCOUNTER — Encounter: Payer: Self-pay | Admitting: Family Medicine

## 2022-06-25 ENCOUNTER — Other Ambulatory Visit: Payer: Self-pay | Admitting: Family Medicine

## 2022-06-25 DIAGNOSIS — Z1231 Encounter for screening mammogram for malignant neoplasm of breast: Secondary | ICD-10-CM

## 2022-06-30 ENCOUNTER — Telehealth: Payer: Medicare PPO | Admitting: Family Medicine

## 2022-06-30 DIAGNOSIS — M9901 Segmental and somatic dysfunction of cervical region: Secondary | ICD-10-CM | POA: Diagnosis not present

## 2022-06-30 DIAGNOSIS — R300 Dysuria: Secondary | ICD-10-CM

## 2022-06-30 DIAGNOSIS — R102 Pelvic and perineal pain: Secondary | ICD-10-CM

## 2022-06-30 DIAGNOSIS — M545 Low back pain, unspecified: Secondary | ICD-10-CM

## 2022-06-30 DIAGNOSIS — M9902 Segmental and somatic dysfunction of thoracic region: Secondary | ICD-10-CM | POA: Diagnosis not present

## 2022-06-30 DIAGNOSIS — M5412 Radiculopathy, cervical region: Secondary | ICD-10-CM | POA: Diagnosis not present

## 2022-06-30 DIAGNOSIS — M62830 Muscle spasm of back: Secondary | ICD-10-CM | POA: Diagnosis not present

## 2022-06-30 NOTE — Progress Notes (Signed)
Because back and belly pain with UTI symptoms, I feel your condition warrants further evaluation and I recommend that you be seen in a face to face visit.   NOTE: There will be NO CHARGE for this eVisit   If you are having a true medical emergency please call 911.      For an urgent face to face visit, [facility] has seven urgent care centers for your convenience:     [facility] [place] Get Driving Directions [phone] [place], [place] [address]    [place] [facility]) Get Driving Directions [phone] [place], [place] [address]  [facility] [place] [facility]) Get Driving Directions [phone] [address] [place],  [place]  [address]  [place] [facility] at [address] [place] Get Driving Directions  [phone] [address] [place],  [place] [address]   [facility] Get Driving Directions [phone] [place], [place], [place] [address]   [facility] Get Driving Directions  [phone] [address] [place], [place] [address]   [facility] [place] Get Driving Directions [phone] [address], [place], [place] [address]  Your MyChart E-visit questionnaire answers were reviewed by a board certified advanced clinical practitioner to complete your personal care plan based on your specific symptoms.  Thank you for using e-Visits.

## 2022-07-04 ENCOUNTER — Other Ambulatory Visit: Payer: Self-pay | Admitting: Family Medicine

## 2022-07-04 DIAGNOSIS — I10 Essential (primary) hypertension: Secondary | ICD-10-CM

## 2022-07-08 ENCOUNTER — Other Ambulatory Visit: Payer: Self-pay | Admitting: Family Medicine

## 2022-07-08 DIAGNOSIS — E782 Mixed hyperlipidemia: Secondary | ICD-10-CM

## 2022-07-08 NOTE — Telephone Encounter (Signed)
Requested Prescriptions  Pending Prescriptions Disp Refills   fenofibrate (TRICOR) 145 MG tablet [Pharmacy Med Name: FENOFIBRATE 145 MG TABLET] 90 tablet 1    Sig: TAKE 1 TABLET (145 MG TOTAL) DAILY BY MOUTH.     Cardiovascular:  Antilipid - Fibric Acid Derivatives Failed - 07/08/2022  1:27 AM      Failed - ALT in normal range and within 360 days    ALT  Date Value Ref Range Status  01/29/2022 40 (H) 0 - 32 IU/L Final         Failed - Lipid Panel in normal range within the last 12 months    Cholesterol, Total  Date Value Ref Range Status  01/29/2022 176 100 - 199 mg/dL Final   LDL Chol Calc (NIH)  Date Value Ref Range Status  01/29/2022 108 (H) 0 - 99 mg/dL Final   HDL  Date Value Ref Range Status  01/29/2022 48 >39 mg/dL Final   Triglycerides  Date Value Ref Range Status  01/29/2022 111 0 - 149 mg/dL Final         Passed - AST in normal range and within 360 days    AST  Date Value Ref Range Status  01/29/2022 24 0 - 40 IU/L Final         Passed - Cr in normal range and within 360 days    Creat  Date Value Ref Range Status  05/10/2017 0.60 0.50 - 0.99 mg/dL Final    Comment:    For patients >1 years of age, the reference limit for Creatinine is approximately 13% higher for people identified as African-American. .    Creatinine, Ser  Date Value Ref Range Status  01/29/2022 0.80 0.57 - 1.00 mg/dL Final         Passed - HGB in normal range and within 360 days    Hemoglobin  Date Value Ref Range Status  01/29/2022 14.8 11.1 - 15.9 g/dL Final         Passed - HCT in normal range and within 360 days    Hematocrit  Date Value Ref Range Status  01/29/2022 45.3 34.0 - 46.6 % Final         Passed - PLT in normal range and within 360 days    Platelets  Date Value Ref Range Status  01/29/2022 221 150 - 450 x10E3/uL Final         Passed - WBC in normal range and within 360 days    WBC  Date Value Ref Range Status  01/29/2022 5.9 3.4 - 10.8 x10E3/uL Final          Passed - eGFR is 30 or above and within 360 days    GFR, Est African American  Date Value Ref Range Status  05/10/2017 115 > OR = 60 mL/min/1.66m2 Final   GFR calc Af Amer  Date Value Ref Range Status  06/10/2020 82 >59 mL/min/1.73 Final    Comment:    **In accordance with recommendations from the NKF-ASN Task force,**   [facility] is in the process of updating its eGFR calculation to the   2021 CKD-EPI creatinine equation that estimates kidney function   without a race variable.    GFR, Est Non African American  Date Value Ref Range Status  05/10/2017 99 > OR = 60 mL/min/1.43m2 Final   GFR calc non Af Amer  Date Value Ref Range Status  06/10/2020 71 >59 mL/min/1.73 Final   eGFR  Date Value Ref Range Status  01/29/2022 82 >59 mL/min/1.73 Final         Passed - Valid encounter within last 12 months    Recent Outpatient Visits           5 months ago Prediabetes   [facility] [place], [place], PA-C   8 months ago Acute non-recurrent frontal sinusitis   [facility] [place], [place], PA-C   11 months ago Welcome to [employer] preventive visit   [facility], Marzella Schlein, MD   1 year ago Prediabetes   [facility] [place], Marzella Schlein, MD   2 years ago Encounter for annual physical exam   [facility], Marzella Schlein, MD       Future Appointments             In 3 weeks Bacigalupo, Marzella Schlein, MD [facility]             rosuvastatin (CRESTOR) 10 MG tablet [Pharmacy Med Name: ROSUVASTATIN CALCIUM 10 MG TAB] 90 tablet 1    Sig: TAKE 1 TABLET BY MOUTH EVERY DAY     Cardiovascular:  Antilipid - Statins 2 Failed - 07/08/2022  1:27 AM      Failed - Lipid Panel in normal range within the last 12 months    Cholesterol, Total  Date Value Ref Range Status  01/29/2022 176 100 - 199 mg/dL Final   LDL Chol Calc (NIH)  Date Value Ref Range Status  01/29/2022 108 (H) 0  - 99 mg/dL Final   HDL  Date Value Ref Range Status  01/29/2022 48 >39 mg/dL Final   Triglycerides  Date Value Ref Range Status  01/29/2022 111 0 - 149 mg/dL Final         Passed - Cr in normal range and within 360 days    Creat  Date Value Ref Range Status  05/10/2017 0.60 0.50 - 0.99 mg/dL Final    Comment:    For patients >69 years of age, the reference limit for Creatinine is approximately 13% higher for people identified as African-American. .    Creatinine, Ser  Date Value Ref Range Status  01/29/2022 0.80 0.57 - 1.00 mg/dL Final         Passed - Patient is not pregnant      Passed - Valid encounter within last 12 months    Recent Outpatient Visits           5 months ago Prediabetes   [facility] [place], [place], PA-C   8 months ago Acute non-recurrent frontal sinusitis   [facility] [place], [place], PA-C   11 months ago Welcome to [employer] preventive visit   [facility], Marzella Schlein, MD   1 year ago Prediabetes   [facility] [facility], Marzella Schlein, MD   2 years ago Encounter for annual physical exam   [facility] Bacigalupo, Marzella Schlein, MD       Future Appointments             In 3 weeks Bacigalupo, Marzella Schlein, MD [facility]             fesoterodine (TOVIAZ) 8 MG TB24 tablet [Pharmacy Med Name: FESOTERODINE ER 8 MG TABLET] 90 tablet 1    Sig: TAKE 1 TABLET (8 MG TOTAL) DAILY BY MOUTH.     Urology:  Bladder Agents 2 Failed - 07/08/2022  1:27 AM      Failed - ALT in  normal range and within 360 days    ALT  Date Value Ref Range Status  01/29/2022 40 (H) 0 - 32 IU/L Final         Passed - Cr in normal range and within 360 days    Creat  Date Value Ref Range Status  05/10/2017 0.60 0.50 - 0.99 mg/dL Final    Comment:    For patients >83 years of age, the reference limit for Creatinine is approximately 13% higher for people identified as  African-American. .    Creatinine, Ser  Date Value Ref Range Status  01/29/2022 0.80 0.57 - 1.00 mg/dL Final         Passed - AST in normal range and within 360 days    AST  Date Value Ref Range Status  01/29/2022 24 0 - 40 IU/L Final         Passed - Valid encounter within last 12 months    Recent Outpatient Visits           5 months ago Prediabetes   [facility] [place], [place], PA-C   8 months ago Acute non-recurrent frontal sinusitis   [facility] [place], [place], PA-C   11 months ago Welcome to [employer] preventive visit   [employer], Dionne Bucy, MD   1 year ago Prediabetes   [facility] [place], Dionne Bucy, MD   2 years ago Encounter for annual physical exam   [facility] [place]igalupo, Dionne Bucy, MD       Future Appointments             In 3 weeks Bacigalupo, Dionne Bucy, MD [facility] [place]

## 2022-07-28 DIAGNOSIS — M62830 Muscle spasm of back: Secondary | ICD-10-CM | POA: Diagnosis not present

## 2022-07-28 DIAGNOSIS — M5412 Radiculopathy, cervical region: Secondary | ICD-10-CM | POA: Diagnosis not present

## 2022-07-28 DIAGNOSIS — M9902 Segmental and somatic dysfunction of thoracic region: Secondary | ICD-10-CM | POA: Diagnosis not present

## 2022-07-28 DIAGNOSIS — M9901 Segmental and somatic dysfunction of cervical region: Secondary | ICD-10-CM | POA: Diagnosis not present

## 2022-07-31 ENCOUNTER — Ambulatory Visit: Payer: Medicare PPO | Admitting: Family Medicine

## 2022-07-31 ENCOUNTER — Encounter: Payer: Self-pay | Admitting: Family Medicine

## 2022-07-31 VITALS — BP 115/66 | HR 69 | Temp 98.0°F | Resp 16 | Ht 63.0 in | Wt 216.0 lb

## 2022-07-31 DIAGNOSIS — F419 Anxiety disorder, unspecified: Secondary | ICD-10-CM | POA: Diagnosis not present

## 2022-07-31 DIAGNOSIS — I10 Essential (primary) hypertension: Secondary | ICD-10-CM | POA: Diagnosis not present

## 2022-07-31 DIAGNOSIS — M7061 Trochanteric bursitis, right hip: Secondary | ICD-10-CM | POA: Diagnosis not present

## 2022-07-31 DIAGNOSIS — R7303 Prediabetes: Secondary | ICD-10-CM | POA: Diagnosis not present

## 2022-07-31 DIAGNOSIS — E782 Mixed hyperlipidemia: Secondary | ICD-10-CM

## 2022-07-31 LAB — POCT GLYCOSYLATED HEMOGLOBIN (HGB A1C)
Est. average glucose Bld gHb Est-mCnc: 128
Hemoglobin A1C: 6.1 % — AB (ref 4.0–5.6)

## 2022-07-31 MED ORDER — ALPRAZOLAM 0.25 MG PO TABS: 0.1250 mg | ORAL_TABLET | Freq: Two times a day (BID) | ORAL | 0 refills | Status: AC | PRN

## 2022-07-31 NOTE — Assessment & Plan Note (Signed)
Well controlled Continue current medications Recheck metabolic panel F/u in 6 months  

## 2022-07-31 NOTE — Progress Notes (Signed)
I,Sulibeya S Dimas,acting as a scribe for Lavon Paganini, MD.,have documented all relevant documentation on the behalf of Lavon Paganini, MD,as directed by  Lavon Paganini, MD while in the presence of Lavon Paganini, MD.     Established patient visit   Patient: Bonnie Duffy   DOB: Dec 03, 1955   67 y.o. Female  MRN: [id number] Visit Date: 07/31/2022  Today's healthcare provider: Lavon Paganini, MD   Chief Complaint  Patient presents with   Hypertension   Hyperglycemia   Anxiety   Subjective    HPI  Hypertension, follow-up  BP Readings from Last 3 Encounters:  07/31/22 115/66  01/29/22 117/77  10/28/21 130/83   Wt Readings from Last 3 Encounters:  07/31/22 216 lb (98 kg)  01/29/22 206 lb 1.6 oz (93.5 kg)  10/28/21 203 lb 9.6 oz (92.4 kg)     She was last seen for hypertension 5 months ago.  BP at that visit was 117/77. Management since that visit includes no changes. She reports excellent compliance with treatment. She is not having side effects.   Outside blood pressures are not being checked.  --------------------------------------------------------------------------------------------------- Prediabetes, Follow-up  Lab Results  Component Value Date   HGBA1C 6.1 (A) 07/31/2022   HGBA1C 5.8 (H) 01/29/2022   HGBA1C 6.0 (A) 01/07/2021   GLUCOSE 98 01/29/2022   GLUCOSE 108 (H) 01/08/2021   GLUCOSE 100 (H) 06/10/2020    Last seen for for this5 months ago.  Management since that visit includes no changes. Current symptoms include none and have been stable.  Pertinent Labs:    Component Value Date/Time   CHOL 176 01/29/2022 0958   TRIG 111 01/29/2022 0958   CHOLHDL 3.7 01/29/2022 0958   CREATININE 0.80 01/29/2022 0958   CREATININE 0.60 05/10/2017 1634    Wt Readings from Last 3 Encounters:  07/31/22 216 lb (98 kg)  01/29/22 206 lb 1.6 oz (93.5 kg)  10/28/21 203 lb 9.6 oz (92.4 kg)   ----------------------------------------------------------------------------------------- Anxiety, Follow-up  She was last seen for anxiety 5 months ago. Changes made at last visit include no changes.   She reports excellent compliance with treatment. Patient reports taking half a tablet of xanax 1-2 times a month.  She reports excellent tolerance of treatment. She is not having side effects.   She feels her anxiety is mild and Unchanged since last visit.   GAD-7 Results    07/31/2022    9:36 AM 01/29/2022    9:08 AM  GAD-7 Generalized Anxiety Disorder Screening Tool  1. Feeling Nervous, Anxious, or on Edge 1 0  2. Not Being Able to Stop or Control Worrying 1 0  3. Worrying Too Much About Different Things 1 0  4. Trouble Relaxing 0 0  5. Being So Restless it's Hard To Sit Still 0 0  6. Becoming Easily Annoyed or Irritable 0 0  7. Feeling Afraid As If Something Awful Might Happen 0 0  Total GAD-7 Score 3 0  Difficulty At Work, Home, or Getting  Along With Others? Not difficult at all Not difficult at all    PHQ-9 Scores    07/31/2022    9:35 AM 07/29/2021   10:05 AM 01/07/2021    3:47 PM  PHQ9 SCORE ONLY  PHQ-9 Total Score 1 0 0    --------------------------------------------------------------------------------------------------- R hip pain - waxes and wanes over several years. [facility] Seeing chiropractor, massage. New spot on lateral hip  Medications: Outpatient Medications Prior to Visit  Medication Sig  benzonatate (TESSALON) 100 MG capsule Take 1 capsule (100 mg total) by mouth 3 (three) times daily as needed for cough.   busPIRone (BUSPAR) 5 MG tablet TAKE 1 TABLET BY MOUTH TWICE A DAY   fenofibrate (TRICOR) 145 MG tablet TAKE 1 TABLET (145 MG TOTAL) DAILY BY MOUTH.   fesoterodine (TOVIAZ) 8 MG TB24 tablet TAKE 1 TABLET (8 MG TOTAL) DAILY BY MOUTH.   fexofenadine (ALLEGRA) 180 MG tablet Take by mouth.   fluticasone (FLONASE) 50 MCG/ACT  nasal spray SPRAY 2 SPRAYS INTO EACH NOSTRIL EVERY DAY   lisinopril-hydrochlorothiazide (ZESTORETIC) 20-25 MG tablet TAKE 1 TABLET BY MOUTH EVERY DAY   magnesium 30 MG tablet Take by mouth 1 day or 1 dose.   metoprolol succinate (TOPROL-XL) 50 MG 24 hr tablet TAKE 0.5 TABLETS (25 MG TOTAL) BY MOUTH DAILY. TAKE WITH OR IMMEDIATELY FOLLOWING A MEAL.   Multiple Vitamins-Minerals (MULTIVITAMIN PO) Take by mouth.   Omega-3 Fatty Acids (FISH OIL) 1200 MG CAPS Take by mouth.   rosuvastatin (CRESTOR) 10 MG tablet TAKE 1 TABLET BY MOUTH EVERY DAY   vitamin C (ASCORBIC ACID) 500 MG tablet Take 500 mg daily by mouth.   Vitamin D, Ergocalciferol, 2000 units CAPS Take by mouth.   [DISCONTINUED] ALPRAZolam (XANAX) 0.25 MG tablet 1/2 TABLET 2 TO 3 TIMES daily  AS NEEDED   No facility-administered medications prior to visit.    Review of Systems  Eyes:  Negative for visual disturbance.  Respiratory:  Negative for chest tightness and shortness of breath.   Cardiovascular:  Negative for chest pain and leg swelling.  Gastrointestinal:  Negative for abdominal pain, nausea and vomiting.  Neurological:  Negative for dizziness, light-headedness and headaches.       Objective    BP 115/66 (BP Location: Left Arm, Patient Position: Sitting, Cuff Size: Large)   Pulse 69   Temp 98 F (36.7 C) (Temporal)   Resp 16   Ht 5\' 3"  (1.6 m)   Wt 216 lb (98 kg)   SpO2 100%   BMI 38.26 kg/m  BP Readings from Last 3 Encounters:  07/31/22 115/66  01/29/22 117/77  10/28/21 130/83   Wt Readings from Last 3 Encounters:  07/31/22 216 lb (98 kg)  01/29/22 206 lb 1.6 oz (93.5 kg)  10/28/21 203 lb 9.6 oz (92.4 kg)      Physical Exam Vitals reviewed.  Constitutional:      General: She is not in acute distress.    Appearance: Normal appearance. She is well-developed. She is not diaphoretic.  HENT:     Head: Normocephalic and atraumatic.  Eyes:     General: No scleral icterus.    Conjunctiva/sclera:  Conjunctivae normal.  Neck:     Thyroid: No thyromegaly.  Cardiovascular:     Rate and Rhythm: Normal rate and regular rhythm.     Pulses: Normal pulses.     Heart sounds: Normal heart sounds. No murmur heard. Pulmonary:     Effort: Pulmonary effort is normal. No respiratory distress.     Breath sounds: Normal breath sounds. No wheezing, rhonchi or rales.  Musculoskeletal:     Cervical back: Neck supple.     Right lower leg: No edema.     Left lower leg: No edema.     Comments: TTP over R trochanteric bursa  Lymphadenopathy:     Cervical: No cervical adenopathy.  Skin:    General: Skin is warm and dry.     Findings: No rash.  Neurological:  Mental Status: She is alert and oriented to person, place, and time. Mental status is at baseline.  Psychiatric:        Mood and Affect: Mood normal.        Behavior: Behavior normal.       Results for orders placed or performed in visit on 07/31/22  POCT glycosylated hemoglobin (Hb A1C)  Result Value Ref Range   Hemoglobin A1C 6.1 (A) 4.0 - 5.6 %   Est. average glucose Bld gHb Est-mCnc 128     Assessment & Plan     Problem List Items Addressed This Visit       Cardiovascular and Mediastinum   Essential hypertension    Well controlled Continue current medications Recheck metabolic panel F/u in 6 months       Relevant Orders   Basic Metabolic Panel (BMET)     Musculoskeletal and Integument   Trochanteric bursitis of right hip    New problem Has tried exercises, ice, tylenol Agrees to CSI today HEP given  INJECTION: Patient was given informed consent,. Appropriate time out was taken. Area prepped and draped in usual sterile fashion. 1 cc of depo-medrol 40 mg/ml plus  4 cc of 1% lidocaine without epinephrine was injected into the R trochanteric bursa using a(n) lateral approach. The patient tolerated the procedure well. There were no complications. Post procedure instructions were given.         Other   Anxiety     Refilled low dose xanax - takes very sparingly      Relevant Medications   ALPRAZolam (XANAX) 0.25 MG tablet   Hyperlipidemia    Previously well controlled Continue statin Repeat FLP and CMP annually      Prediabetes - Primary    A1c up a little Encourage low carb diet      Relevant Orders   POCT glycosylated hemoglobin (Hb A1C) (Completed)   Morbid obesity (HCC)    BMI 38 and assoc with HTN, HLD, prediabetes Discussed importance of healthy weight management Discussed diet and exercise         Return in about 6 months (around 01/29/2023) for AWV, CPE.      I, Lavon Paganini, MD, have reviewed all documentation for this visit. The documentation on 07/31/22 for the exam, diagnosis, procedures, and orders are all accurate and complete.   Bonnie Duffy, Dionne Bucy, MD, MPH [facility]

## 2022-07-31 NOTE — Assessment & Plan Note (Signed)
A1c up a little Encourage low carb diet

## 2022-07-31 NOTE — Assessment & Plan Note (Signed)
Previously well controlled Continue statin Repeat FLP and CMP annually 

## 2022-07-31 NOTE — Assessment & Plan Note (Signed)
BMI 38 and assoc with HTN, HLD, prediabetes Discussed importance of healthy weight management Discussed diet and exercise

## 2022-07-31 NOTE — Assessment & Plan Note (Signed)
New problem Has tried exercises, ice, tylenol Agrees to CSI today HEP given  INJECTION: Patient was given informed consent,. Appropriate time out was taken. Area prepped and draped in usual sterile fashion. 1 cc of depo-medrol 40 mg/ml plus  4 cc of 1% lidocaine without epinephrine was injected into the R trochanteric bursa using a(n) lateral approach. The patient tolerated the procedure well. There were no complications. Post procedure instructions were given.

## 2022-07-31 NOTE — Assessment & Plan Note (Signed)
Refilled low dose xanax - takes very sparingly

## 2022-08-01 LAB — BASIC METABOLIC PANEL
BUN/Creatinine Ratio: 17 (ref 12–28)
BUN: 13 mg/dL (ref 8–27)
CO2: 21 mmol/L (ref 20–29)
Calcium: 10.1 mg/dL (ref 8.7–10.3)
Chloride: 106 mmol/L (ref 96–106)
Creatinine, Ser: 0.75 mg/dL (ref 0.57–1.00)
Glucose: 100 mg/dL — ABNORMAL HIGH (ref 70–99)
Potassium: 4.8 mmol/L (ref 3.5–5.2)
Sodium: 143 mmol/L (ref 134–144)
eGFR: 88 mL/min/{1.73_m2} (ref >59)

## 2022-08-10 ENCOUNTER — Ambulatory Visit
Admission: RE | Admit: 2022-08-10 | Discharge: 2022-08-10 | Disposition: A | Discharge status: Home / Self Care | Payer: Medicare PPO | Source: Ambulatory Visit | Attending: Family Medicine | Admitting: Family Medicine

## 2022-08-10 DIAGNOSIS — Z1231 Encounter for screening mammogram for malignant neoplasm of breast: Secondary | ICD-10-CM | POA: Diagnosis not present

## 2022-08-12 ENCOUNTER — Other Ambulatory Visit: Payer: Self-pay | Admitting: Family Medicine

## 2022-08-12 DIAGNOSIS — N6489 Other specified disorders of breast: Secondary | ICD-10-CM

## 2022-08-12 DIAGNOSIS — R928 Other abnormal and inconclusive findings on diagnostic imaging of breast: Secondary | ICD-10-CM

## 2022-08-18 ENCOUNTER — Ambulatory Visit
Admission: RE | Admit: 2022-08-18 | Discharge: 2022-08-18 | Disposition: A | Discharge status: Home / Self Care | Payer: Medicare PPO | Source: Ambulatory Visit | Attending: Family Medicine | Admitting: Family Medicine

## 2022-08-18 DIAGNOSIS — N6489 Other specified disorders of breast: Secondary | ICD-10-CM | POA: Insufficient documentation

## 2022-08-18 DIAGNOSIS — R928 Other abnormal and inconclusive findings on diagnostic imaging of breast: Secondary | ICD-10-CM | POA: Insufficient documentation

## 2022-08-18 DIAGNOSIS — R922 Inconclusive mammogram: Secondary | ICD-10-CM | POA: Diagnosis not present

## 2022-08-25 DIAGNOSIS — M5412 Radiculopathy, cervical region: Secondary | ICD-10-CM | POA: Diagnosis not present

## 2022-08-25 DIAGNOSIS — M9901 Segmental and somatic dysfunction of cervical region: Secondary | ICD-10-CM | POA: Diagnosis not present

## 2022-08-25 DIAGNOSIS — M62830 Muscle spasm of back: Secondary | ICD-10-CM | POA: Diagnosis not present

## 2022-08-25 DIAGNOSIS — M9902 Segmental and somatic dysfunction of thoracic region: Secondary | ICD-10-CM | POA: Diagnosis not present

## 2022-09-03 ENCOUNTER — Encounter (INDEPENDENT_AMBULATORY_CARE_PROVIDER_SITE_OTHER): Payer: Self-pay

## 2022-09-22 DIAGNOSIS — M9902 Segmental and somatic dysfunction of thoracic region: Secondary | ICD-10-CM | POA: Diagnosis not present

## 2022-09-22 DIAGNOSIS — M62830 Muscle spasm of back: Secondary | ICD-10-CM | POA: Diagnosis not present

## 2022-09-22 DIAGNOSIS — M9901 Segmental and somatic dysfunction of cervical region: Secondary | ICD-10-CM | POA: Diagnosis not present

## 2022-09-22 DIAGNOSIS — M5412 Radiculopathy, cervical region: Secondary | ICD-10-CM | POA: Diagnosis not present

## 2022-09-29 ENCOUNTER — Other Ambulatory Visit: Payer: Self-pay | Admitting: Family Medicine

## 2022-09-29 DIAGNOSIS — Z9109 Other allergy status, other than to drugs and biological substances: Secondary | ICD-10-CM

## 2022-10-01 ENCOUNTER — Other Ambulatory Visit: Payer: Self-pay | Admitting: Family Medicine

## 2022-10-01 DIAGNOSIS — I10 Essential (primary) hypertension: Secondary | ICD-10-CM

## 2022-10-01 NOTE — Telephone Encounter (Signed)
Requested Prescriptions  Pending Prescriptions Disp Refills   metoprolol succinate (TOPROL-XL) 50 MG 24 hr tablet [Pharmacy Med Name: METOPROLOL SUCC ER 50 MG TAB] 45 tablet 0    Sig: TAKE 0.5 TABLETS (25 MG TOTAL) BY MOUTH DAILY. TAKE WITH OR IMMEDIATELY FOLLOWING A MEAL.     Cardiovascular:  Beta Blockers Passed - 10/01/2022  2:30 PM      Passed - Last BP in normal range    BP Readings from Last 1 Encounters:  07/31/22 115/66         Passed - Last Heart Rate in normal range    Pulse Readings from Last 1 Encounters:  07/31/22 69         Passed - Valid encounter within last 6 months    Recent Outpatient Visits           2 months ago [place], Dionne Bucy, MD   8 months ago Prediabetes   [place], [place], [place]   11 months ago Acute non-recurrent frontal sinusitis   [place] Mikey [place]sch[place], PA-C   1 year ago Welcome to [employer] preventive visit   [facility] [place], Dionne Bucy, MD   1 year ago Prediabetes   [facility] [place], Dionne Bucy, MD

## 2022-10-05 DIAGNOSIS — H43811 Vitreous degeneration, right eye: Secondary | ICD-10-CM | POA: Diagnosis not present

## 2022-10-20 DIAGNOSIS — M5412 Radiculopathy, cervical region: Secondary | ICD-10-CM | POA: Diagnosis not present

## 2022-10-20 DIAGNOSIS — M62830 Muscle spasm of back: Secondary | ICD-10-CM | POA: Diagnosis not present

## 2022-10-20 DIAGNOSIS — M9902 Segmental and somatic dysfunction of thoracic region: Secondary | ICD-10-CM | POA: Diagnosis not present

## 2022-10-20 DIAGNOSIS — M9901 Segmental and somatic dysfunction of cervical region: Secondary | ICD-10-CM | POA: Diagnosis not present

## 2022-11-02 DIAGNOSIS — M5412 Radiculopathy, cervical region: Secondary | ICD-10-CM | POA: Diagnosis not present

## 2022-11-02 DIAGNOSIS — M9902 Segmental and somatic dysfunction of thoracic region: Secondary | ICD-10-CM | POA: Diagnosis not present

## 2022-11-02 DIAGNOSIS — M9901 Segmental and somatic dysfunction of cervical region: Secondary | ICD-10-CM | POA: Diagnosis not present

## 2022-11-02 DIAGNOSIS — M62830 Muscle spasm of back: Secondary | ICD-10-CM | POA: Diagnosis not present

## 2022-11-06 DIAGNOSIS — M5412 Radiculopathy, cervical region: Secondary | ICD-10-CM | POA: Diagnosis not present

## 2022-11-06 DIAGNOSIS — M9901 Segmental and somatic dysfunction of cervical region: Secondary | ICD-10-CM | POA: Diagnosis not present

## 2022-11-06 DIAGNOSIS — M62830 Muscle spasm of back: Secondary | ICD-10-CM | POA: Diagnosis not present

## 2022-11-06 DIAGNOSIS — M9902 Segmental and somatic dysfunction of thoracic region: Secondary | ICD-10-CM | POA: Diagnosis not present

## 2022-11-11 DIAGNOSIS — M62830 Muscle spasm of back: Secondary | ICD-10-CM | POA: Diagnosis not present

## 2022-11-11 DIAGNOSIS — M9901 Segmental and somatic dysfunction of cervical region: Secondary | ICD-10-CM | POA: Diagnosis not present

## 2022-11-11 DIAGNOSIS — M9902 Segmental and somatic dysfunction of thoracic region: Secondary | ICD-10-CM | POA: Diagnosis not present

## 2022-11-11 DIAGNOSIS — M5412 Radiculopathy, cervical region: Secondary | ICD-10-CM | POA: Diagnosis not present

## 2022-11-17 DIAGNOSIS — H43811 Vitreous degeneration, right eye: Secondary | ICD-10-CM | POA: Diagnosis not present

## 2022-11-18 DIAGNOSIS — M9901 Segmental and somatic dysfunction of cervical region: Secondary | ICD-10-CM | POA: Diagnosis not present

## 2022-11-18 DIAGNOSIS — M9902 Segmental and somatic dysfunction of thoracic region: Secondary | ICD-10-CM | POA: Diagnosis not present

## 2022-11-18 DIAGNOSIS — M62830 Muscle spasm of back: Secondary | ICD-10-CM | POA: Diagnosis not present

## 2022-11-18 DIAGNOSIS — M5412 Radiculopathy, cervical region: Secondary | ICD-10-CM | POA: Diagnosis not present

## 2022-12-16 DIAGNOSIS — M5412 Radiculopathy, cervical region: Secondary | ICD-10-CM | POA: Diagnosis not present

## 2022-12-16 DIAGNOSIS — M62830 Muscle spasm of back: Secondary | ICD-10-CM | POA: Diagnosis not present

## 2022-12-16 DIAGNOSIS — M9902 Segmental and somatic dysfunction of thoracic region: Secondary | ICD-10-CM | POA: Diagnosis not present

## 2022-12-16 DIAGNOSIS — M9901 Segmental and somatic dysfunction of cervical region: Secondary | ICD-10-CM | POA: Diagnosis not present

## 2022-12-26 ENCOUNTER — Other Ambulatory Visit: Payer: Self-pay | Admitting: Family Medicine

## 2022-12-31 ENCOUNTER — Other Ambulatory Visit: Payer: Self-pay | Admitting: Family Medicine

## 2022-12-31 DIAGNOSIS — E782 Mixed hyperlipidemia: Secondary | ICD-10-CM

## 2023-01-13 DIAGNOSIS — M9901 Segmental and somatic dysfunction of cervical region: Secondary | ICD-10-CM | POA: Diagnosis not present

## 2023-01-13 DIAGNOSIS — M9902 Segmental and somatic dysfunction of thoracic region: Secondary | ICD-10-CM | POA: Diagnosis not present

## 2023-01-13 DIAGNOSIS — M62830 Muscle spasm of back: Secondary | ICD-10-CM | POA: Diagnosis not present

## 2023-01-13 DIAGNOSIS — M5412 Radiculopathy, cervical region: Secondary | ICD-10-CM | POA: Diagnosis not present

## 2023-01-28 IMAGING — MG MM DIGITAL SCREENING BILAT W/ TOMO AND CAD
6 of 10 series · 6 of 30 positions shown · non-contrast
Comparison: Previous exam(s).

CLINICAL DATA: Screening.

EXAM:
DIGITAL SCREENING BILATERAL MAMMOGRAM WITH TOMOSYNTHESIS AND CAD
TECHNIQUE: Bilateral screening digital craniocaudal and mediolateral oblique
mammograms were obtained. Bilateral screening digital breast
tomosynthesis was performed. The images were evaluated with
computer-aided detection.

[R MLO synth-2D]
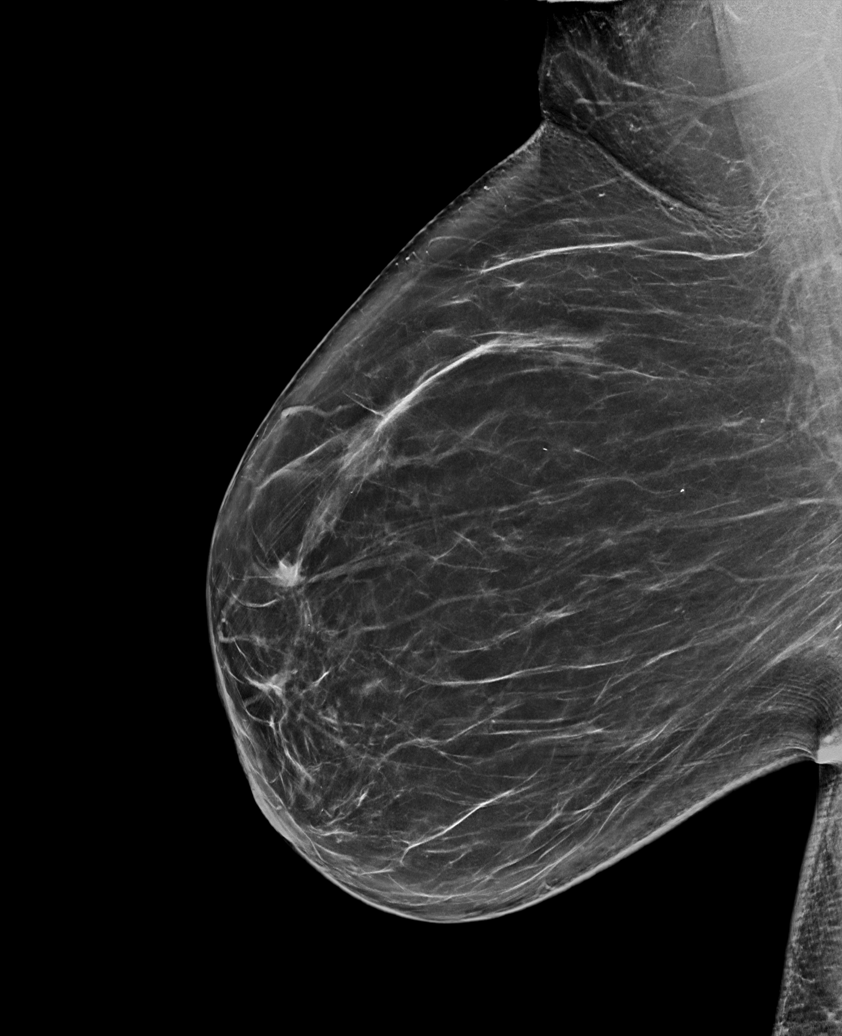

[R CC synth-2D]
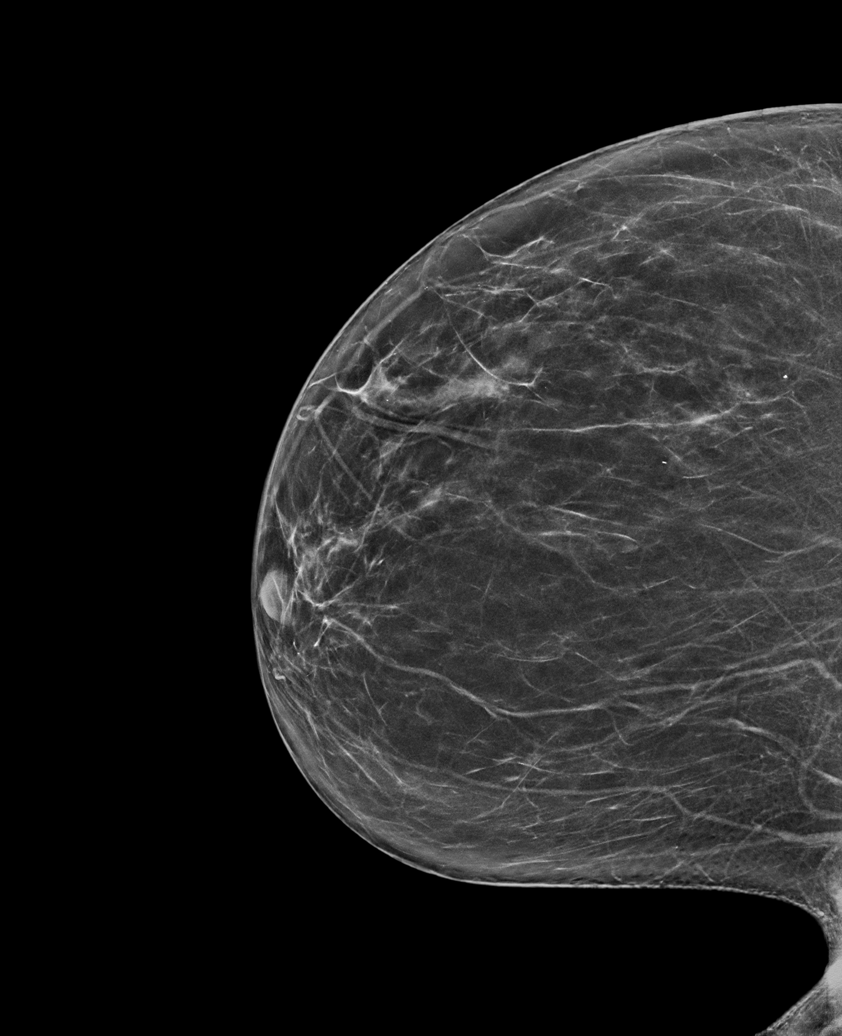

[L CC synth-2D]
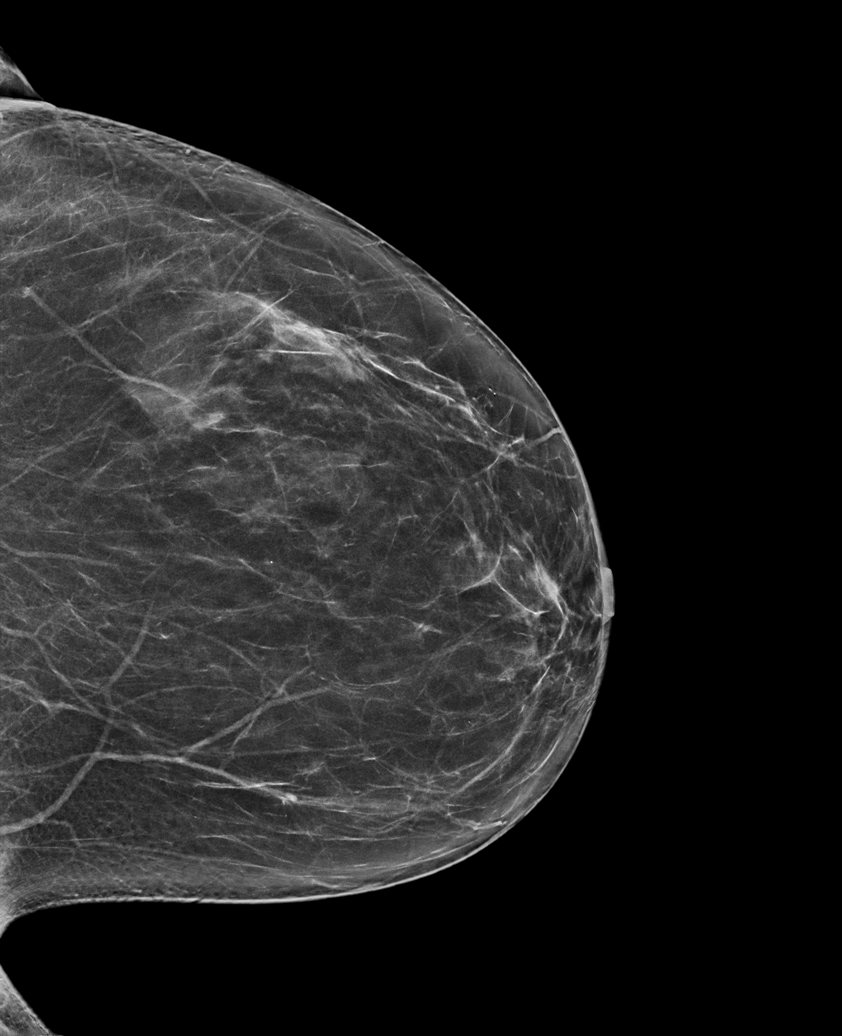

[L MLO synth-2D (1 of 2)]
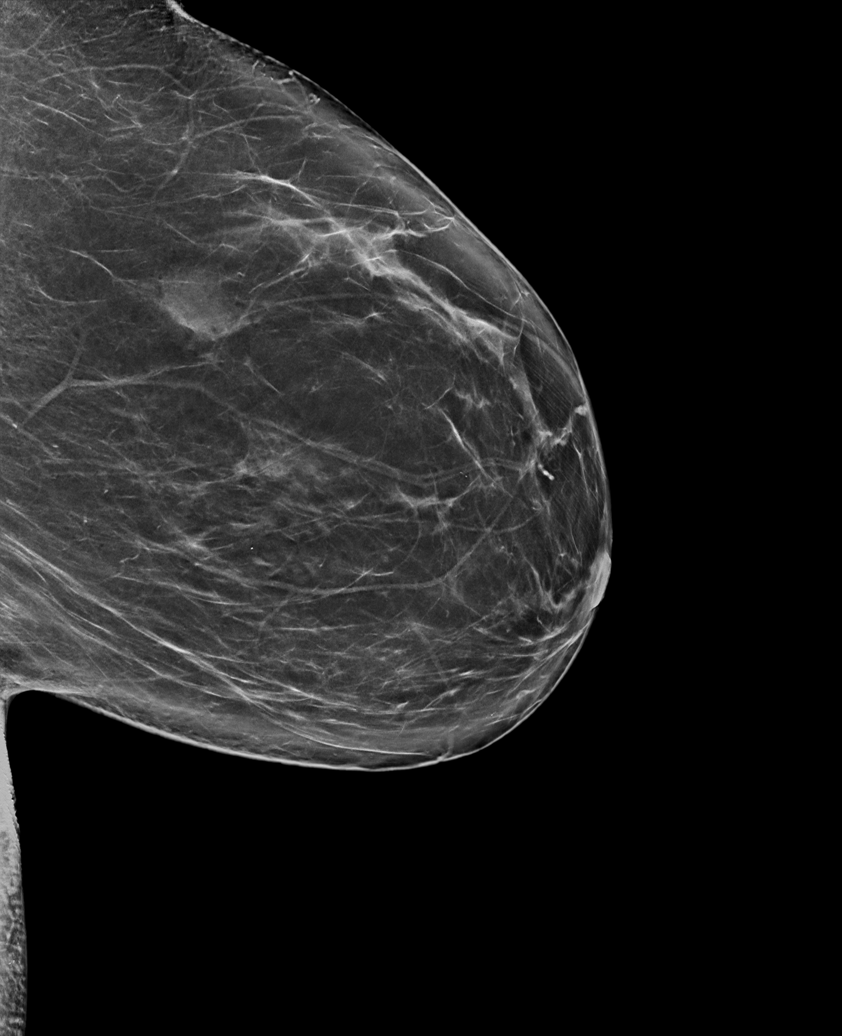

[L MLO synth-2D (2 of 2)]
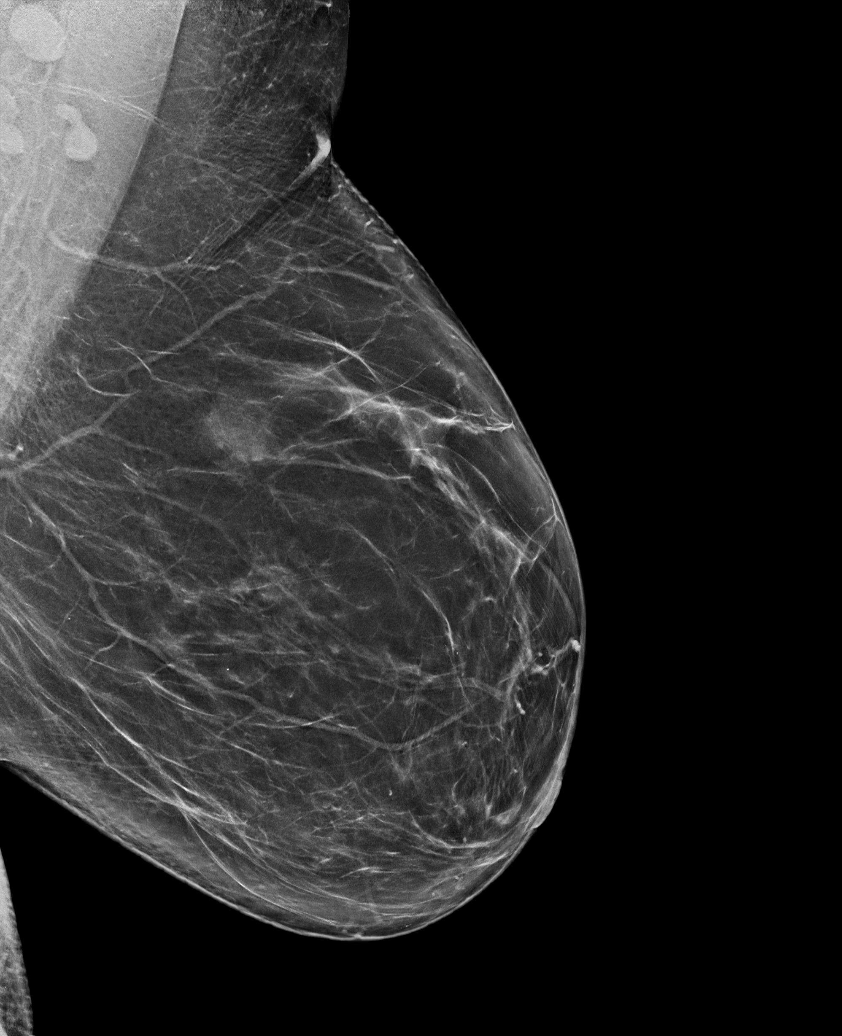

[R CC tomo · tomo slice 35/69.0]
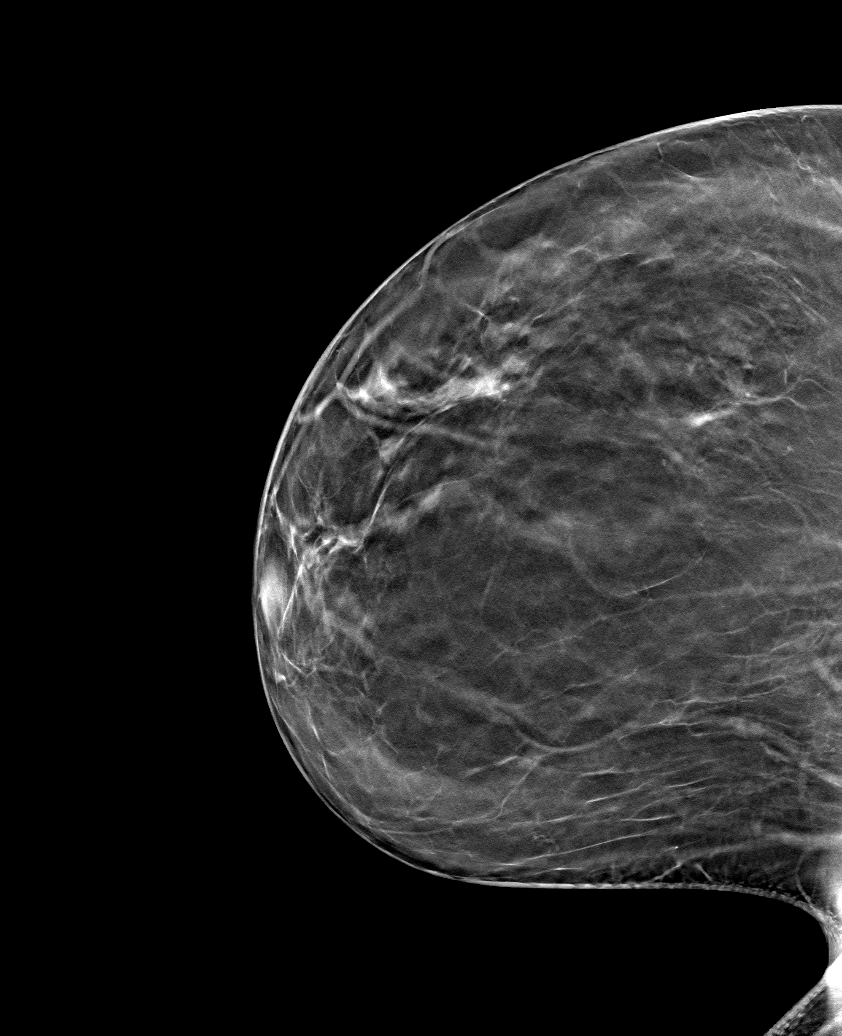

[6 of 30 positions shown; findings below may reference images not displayed]

ACR Breast Density Category b: There are scattered areas of
fibroglandular density.
FINDINGS: There are no findings suspicious for malignancy.
IMPRESSION: No mammographic evidence of malignancy. A result letter of this
screening mammogram will be mailed directly to the patient.

RECOMMENDATION:
Screening mammogram in one year. (Code:51-O-LD2)

BI-RADS CATEGORY  1: Negative.

## 2023-02-01 ENCOUNTER — Ambulatory Visit (INDEPENDENT_AMBULATORY_CARE_PROVIDER_SITE_OTHER): Payer: Medicare PPO | Admitting: Family Medicine

## 2023-02-01 ENCOUNTER — Ambulatory Visit
Admission: RE | Admit: 2023-02-01 | Discharge: 2023-02-01 | Disposition: A | Discharge status: Home / Self Care | Payer: Medicare PPO | Source: Ambulatory Visit | Attending: Family Medicine | Admitting: Family Medicine

## 2023-02-01 ENCOUNTER — Encounter: Payer: Self-pay | Admitting: Family Medicine

## 2023-02-01 ENCOUNTER — Ambulatory Visit
Admission: RE | Admit: 2023-02-01 | Discharge: 2023-02-01 | Disposition: A | Discharge status: Home / Self Care | Payer: Medicare PPO | Attending: Family Medicine | Admitting: Family Medicine

## 2023-02-01 VITALS — BP 127/79 | HR 79 | Temp 98.0°F | Resp 12 | Ht 63.0 in | Wt 218.1 lb

## 2023-02-01 DIAGNOSIS — I10 Essential (primary) hypertension: Secondary | ICD-10-CM | POA: Diagnosis not present

## 2023-02-01 DIAGNOSIS — R232 Flushing: Secondary | ICD-10-CM

## 2023-02-01 DIAGNOSIS — Z0000 Encounter for general adult medical examination without abnormal findings: Secondary | ICD-10-CM

## 2023-02-01 DIAGNOSIS — M7061 Trochanteric bursitis, right hip: Secondary | ICD-10-CM

## 2023-02-01 DIAGNOSIS — E559 Vitamin D deficiency, unspecified: Secondary | ICD-10-CM | POA: Diagnosis not present

## 2023-02-01 DIAGNOSIS — E782 Mixed hyperlipidemia: Secondary | ICD-10-CM | POA: Diagnosis not present

## 2023-02-01 DIAGNOSIS — M25551 Pain in right hip: Secondary | ICD-10-CM | POA: Diagnosis not present

## 2023-02-01 DIAGNOSIS — Z87891 Personal history of nicotine dependence: Secondary | ICD-10-CM

## 2023-02-01 DIAGNOSIS — M47816 Spondylosis without myelopathy or radiculopathy, lumbar region: Secondary | ICD-10-CM | POA: Diagnosis not present

## 2023-02-01 DIAGNOSIS — R7303 Prediabetes: Secondary | ICD-10-CM | POA: Diagnosis not present

## 2023-02-01 DIAGNOSIS — F419 Anxiety disorder, unspecified: Secondary | ICD-10-CM

## 2023-02-01 DIAGNOSIS — Z6838 Body mass index (BMI) 38.0-38.9, adult: Secondary | ICD-10-CM

## 2023-02-01 MED ORDER — LIDOCAINE HCL 1 % IJ SOLN
10.0000 mL | Freq: Once | INTRAMUSCULAR | Status: AC
  Administered 2023-02-01: 4 mL

## 2023-02-01 MED ORDER — METHYLPREDNISOLONE ACETATE 40 MG/ML IJ SUSP
40.0000 mg | Freq: Once | INTRAMUSCULAR | Status: AC
  Administered 2023-02-01: 40 mg via INTRA_ARTICULAR

## 2023-02-01 MED ORDER — TERBINAFINE HCL 250 MG PO TABS: 250.0000 mg | ORAL_TABLET | Freq: Every day | ORAL | 0 refills | Status: AC

## 2023-02-01 NOTE — Assessment & Plan Note (Signed)
Well controlled Continue current medications Recheck metabolic panel F/u in 6 months  

## 2023-02-01 NOTE — Assessment & Plan Note (Signed)
BMI 38 and assoc with HTN, HLD, prediabetes Discussed importance of healthy weight management Discussed diet and exercise

## 2023-02-01 NOTE — Progress Notes (Signed)
Annual Wellness Visit     Patient: Bonnie Duffy, Female    DOB: [birth year], 67 y.o.   MRN: [id number]  Subjective  Chief Complaint  Patient presents with   Medicare Wellness    Bonnie Duffy is a 67 y.o. female who presents today for her Annual Wellness Visit. She reports consuming a general diet.  She generally feels fairly well. She reports sleeping fairly well. She does have additional problems to discuss today.   Discussed the use of AI scribe software for clinical note transcription with the patient, who gave verbal consent to proceed.   HPI  Discussed the use of AI scribe software for clinical note transcription with the patient, who gave verbal consent to proceed.  History of Present Illness   The patient, with a history of anxiety and a significant smoking history, presents for a wellness visit with several concerns. The primary concern is a recurring R hip problem, which had previously been treated with a steroid injection for trochanteric bursitis six months ago. The patient reports that the injection provided relief for about three months, but the pain has since returned. The patient also mentions a decrease in the range of motion in the affected hip.  In addition to the hip issue, the patient reports a persistent toenail fungus that has been resistant to over-the-counter treatments. The patient also describes intermittent sensations of hot and cold, which sometimes lead to chills and a feeling of being unwell. These episodes are sporadic and do not seem to follow a specific pattern.  The patient also reports a tingling sensation that starts in the jaw, travels down the neck, and extends to the arm. This sensation is also intermittent and seems to occur more frequently when the patient is sitting. The patient mentions a history of COVID-19 infection and suggests that these unusual symptoms started after the infection.  The patient also mentions a history of  anxiety, which is currently well-controlled with Buspar and occasional use of Xanax. The patient's blood pressure is well-managed.            Medications: Outpatient Medications Prior to Visit  Medication Sig   ALPRAZolam (XANAX) 0.25 MG tablet Take 0.5 tablets (0.125 mg total) by mouth 2 (two) times daily as needed for anxiety.   benzonatate (TESSALON) 100 MG capsule Take 1 capsule (100 mg total) by mouth 3 (three) times daily as needed for cough.   busPIRone (BUSPAR) 5 MG tablet TAKE 1 TABLET BY MOUTH TWICE A DAY   fenofibrate (TRICOR) 145 MG tablet TAKE 1 TABLET (145 MG TOTAL) DAILY BY MOUTH.   fesoterodine (TOVIAZ) 8 MG TB24 tablet TAKE 1 TABLET (8 MG TOTAL) DAILY BY MOUTH.   fexofenadine (ALLEGRA) 180 MG tablet Take by mouth.   fluticasone (FLONASE) 50 MCG/ACT nasal spray SPRAY 2 SPRAYS INTO EACH NOSTRIL EVERY DAY   lisinopril-hydrochlorothiazide (ZESTORETIC) 20-25 MG tablet TAKE 1 TABLET BY MOUTH EVERY DAY   magnesium 30 MG tablet Take by mouth 1 day or 1 dose.   metoprolol succinate (TOPROL-XL) 50 MG 24 hr tablet TAKE 0.5 TABLETS (25 MG TOTAL) BY MOUTH DAILY. TAKE WITH OR IMMEDIATELY FOLLOWING A MEAL.   Multiple Vitamins-Minerals (MULTIVITAMIN PO) Take by mouth.   Omega-3 Fatty Acids (FISH OIL) 1200 MG CAPS Take by mouth.   rosuvastatin (CRESTOR) 10 MG tablet TAKE 1 TABLET BY MOUTH EVERY DAY   vitamin C (ASCORBIC ACID) 500 MG tablet Take 500 mg daily by mouth.   Vitamin D, Ergocalciferol, 2000  units CAPS Take by mouth.   No facility-administered medications prior to visit.    Allergies  Allergen Reactions   Penicillins     No reactions noted.    Sulfamethoxazole Rash    Patient Care Team: Erasmo Downer, MD as PCP - General (Family Medicine)  ROS per HPI      Objective  BP 127/79 (BP Location: Left Arm, Patient Position: Sitting, Cuff Size: Large)   Pulse 79   Temp 98 F (36.7 C) (Temporal)   Resp 12   Ht 5\' 3"  (1.6 m)   Wt 218 lb 1.6 oz (98.9 kg)    SpO2 (!) 77%   BMI 38.63 kg/m    Physical Exam Vitals reviewed.  Constitutional:      General: She is not in acute distress.    Appearance: Normal appearance. She is well-developed. She is not diaphoretic.  HENT:     Head: Normocephalic and atraumatic.     Right Ear: Tympanic membrane, ear canal and external ear normal.     Left Ear: Tympanic membrane, ear canal and external ear normal.     Nose: Nose normal.     Mouth/Throat:     Mouth: Mucous membranes are moist.     Pharynx: Oropharynx is clear. No oropharyngeal exudate.  Eyes:     General: No scleral icterus.    Conjunctiva/sclera: Conjunctivae normal.     Pupils: Pupils are equal, round, and reactive to light.  Neck:     Thyroid: No thyromegaly.  Cardiovascular:     Rate and Rhythm: Normal rate and regular rhythm.     Heart sounds: Normal heart sounds. No murmur heard. Pulmonary:     Effort: Pulmonary effort is normal. No respiratory distress.     Breath sounds: Normal breath sounds. No wheezing or rales.  Abdominal:     General: There is no distension.     Palpations: Abdomen is soft.     Tenderness: There is no abdominal tenderness.  Musculoskeletal:        General: No deformity.     Cervical back: Neck supple.     Right lower leg: No edema.     Left lower leg: No edema.     Comments: TTP over R greater trochanter, loss of external rotation of R hip compared to L  Lymphadenopathy:     Cervical: Cervical adenopathy (R cervical LN stably enlarged - s/p previous benign biopsy) present.  Skin:    General: Skin is warm and dry.     Findings: No rash.  Neurological:     Mental Status: She is alert and oriented to person, place, and time. Mental status is at baseline.     Sensory: No sensory deficit.     Motor: No weakness.     Gait: Gait normal.  Psychiatric:        Mood and Affect: Mood normal.        Behavior: Behavior normal.        Thought Content: Thought content normal.       Most recent functional  status assessment:    02/01/2023    8:18 AM  In your present state of health, do you have any difficulty performing the following activities:  Hearing? 0  Vision? 0  Difficulty concentrating or making decisions? 0  Walking or climbing stairs? 0  Dressing or bathing? 0  Doing errands, shopping? 0   Most recent fall risk assessment:    02/01/2023    8:18 AM  Fall Risk   Falls in the past year? 0  Number falls in past yr: 0  Injury with Fall? 0  Risk for fall due to : No Fall Risks  Follow up Falls evaluation completed    Most recent depression screenings:    02/01/2023    8:18 AM 07/31/2022    9:35 AM  PHQ 2/9 Scores  PHQ - 2 Score 0 0  PHQ- 9 Score 3 1   Most recent cognitive screening:    07/29/2021   10:06 AM  6CIT Screen  What Year? 0 points  What month? 0 points  What time? 0 points  Count back from 20 0 points  Months in reverse 0 points  Repeat phrase 2 points  Total Score 2 points   Most recent Audit-C alcohol use screening    02/01/2023    8:19 AM  Alcohol Use Disorder Test (AUDIT)  1. How often do you have a drink containing alcohol? 1  2. How many drinks containing alcohol do you have on a typical day when you are drinking? 0  3. How often do you have six or more drinks on one occasion? 0  AUDIT-C Score 1   A score of 3 or more in women, and 4 or more in men indicates increased risk for alcohol abuse, EXCEPT if all of the points are from question 1   Vision/Hearing Screen: No results found.    No results found for any visits on 02/01/23.    Assessment & Plan   Annual wellness visit done today including the all of the following: Reviewed patient's Family Medical History Reviewed and updated list of patient's medical providers Assessment of cognitive impairment was done Assessed patient's functional ability Established a written schedule for health screening services Health Risk Assessent Completed and Reviewed  Exercise Activities and Dietary  recommendations  Goals   None     Immunization History  Administered Date(s) Administered   COVID-19, mRNA, vaccine(Comirnaty)12 years and older 04/10/2022   Fluad Quad(high Dose 65+) 04/10/2022   Hepatitis B 10/03/1992, 11/04/1992, 04/24/1993   Influenza Inj Mdck Quad Pf 03/18/2021   Influenza,inj,Quad PF,6+ Mos 03/29/2017, 03/02/2018, 03/15/2020   Influenza,inj,quad, With Preservative 03/10/2010   Influenza-Unspecified 04/24/2013, 02/26/2016, 03/10/2019   PFIZER(Purple Top)SARS-COV-2 Vaccination 09/19/2019, 10/10/2019, 06/10/2020   PNEUMOCOCCAL CONJUGATE-20 07/29/2021   Pfizer Covid-19 Vaccine Bivalent Booster 70yrs & up 03/18/2021, 04/10/2022   Pneumococcal Polysaccharide-23 03/13/2012   Td 06/09/2019   Tdap 10/17/2008   Zoster Recombinant(Shingrix) 12/15/2018, 06/09/2019    Health Maintenance  Topic Date Due   Lung Cancer Screening  Never done   COVID-19 Vaccine (5 - 2023-24 season) 08/11/2022   INFLUENZA VACCINE  09/27/2023 (Originally 01/28/2023)   Fecal DNA (Cologuard)  01/14/2024   Medicare Annual Wellness (AWV)  02/01/2024   MAMMOGRAM  08/10/2024   DTaP/Tdap/Td (3 - Td or Tdap) 06/08/2029   Pneumonia Vaccine 60+ Years old  Completed   DEXA SCAN  Completed   Hepatitis C Screening  Completed   Zoster Vaccines- Shingrix  Completed   HPV VACCINES  Aged Out     Discussed health benefits of physical activity, and encouraged her to engage in regular exercise appropriate for her age and condition.    Problem List Items Addressed This Visit       Cardiovascular and Mediastinum   Essential hypertension    Well controlled Continue current medications Recheck metabolic panel F/u in 6 months       Relevant Orders   Comprehensive metabolic  panel     Musculoskeletal and Integument   Trochanteric bursitis of right hip     Other   Anxiety   Hyperlipidemia    Previously well controlled Continue statin Repeat FLP and CMP      Relevant Orders   Comprehensive  metabolic panel   Lipid panel   Prediabetes    Recommend low carb diet Recheck A1c       Relevant Orders   Hemoglobin A1c   Avitaminosis D   Relevant Orders   VITAMIN D 25 Hydroxy (Vit-D Deficiency, Fractures)   Morbid obesity (HCC)    BMI 38 and assoc with HTN, HLD, prediabetes Discussed importance of healthy weight management Discussed diet and exercise       Other Visit Diagnoses     Encounter for annual wellness visit (AWV) in Medicare patient    -  Primary   Encounter for annual physical exam       Relevant Orders   Comprehensive metabolic panel   Lipid panel   VITAMIN D 25 Hydroxy (Vit-D Deficiency, Fractures)   Hemoglobin A1c   TSH   Right hip pain       Relevant Medications   methylPREDNISolone acetate (DEPO-MEDROL) injection 40 mg (Completed)   lidocaine (XYLOCAINE) 1 % (with pres) injection 10 mL (Completed)   Other Relevant Orders   DG Hip Unilat W OR W/O Pelvis 2-3 Views Right   Hot flashes       Relevant Orders   TSH   History of tobacco use, presenting hazards to health       Relevant Orders   Ambulatory Referral Lung Cancer Screening [facility]          Hip Pain Recurrence of hip pain after previous steroid injection with limited range of motion. Possible arthritis in the hip joint. -Order hip X-ray to assess for arthritis. -Administer steroid injection to the hip today for trochanteric bursitis.  Onychomycosis (Toenail Fungus) Persistent despite over-the-counter treatment. -Start Terbinafine 250mg  daily for 90 days.  Intermittent Hot/Cold Flashes Patient reports intermittent hot and cold flashes, with some fatigue. No clear pattern identified. -Order labs including thyroid function tests to rule out thyroid dysfunction.  Neck and Arm Tingling Intermittent tingling sensation in the neck, jaw, and arm, primarily when sitting. No signs of radiculopathy on examination. -Recommend evaluation by chiropractor (who she sees regularly) -  consider prednisone taper in the future if persists.  General Health Maintenance -Continue Buspar for anxiety management. -Blood pressure well controlled, no changes to current management. -Plan for flu and COVID booster shots in September 2024. -Order lung cancer screening due to significant smoking history. -Schedule follow-up in 6 months.      INJECTION: Patient was given informed consent,. Appropriate time out was taken. Area prepped and draped in usual sterile fashion. 1 cc of depo-medrol 40 mg/ml plus  4 cc of 1% lidocaine without epinephrine was injected into the R trochanteric bursa using a(n) lateral approach. The patient tolerated the procedure well. There were no complications. Post procedure instructions were given.     Return in about 6 months (around 08/04/2023) for chronic disease f/u.     Shirlee Latch, MD

## 2023-02-01 NOTE — Assessment & Plan Note (Signed)
Recommend low carb diet °Recheck A1c  °

## 2023-02-01 NOTE — Assessment & Plan Note (Signed)
Previously well controlled Continue statin Repeat FLP and CMP  

## 2023-02-10 DIAGNOSIS — M62830 Muscle spasm of back: Secondary | ICD-10-CM | POA: Diagnosis not present

## 2023-02-10 DIAGNOSIS — M9901 Segmental and somatic dysfunction of cervical region: Secondary | ICD-10-CM | POA: Diagnosis not present

## 2023-02-10 DIAGNOSIS — M9902 Segmental and somatic dysfunction of thoracic region: Secondary | ICD-10-CM | POA: Diagnosis not present

## 2023-02-10 DIAGNOSIS — M5412 Radiculopathy, cervical region: Secondary | ICD-10-CM | POA: Diagnosis not present

## 2023-03-09 DIAGNOSIS — M5412 Radiculopathy, cervical region: Secondary | ICD-10-CM | POA: Diagnosis not present

## 2023-03-09 DIAGNOSIS — M9901 Segmental and somatic dysfunction of cervical region: Secondary | ICD-10-CM | POA: Diagnosis not present

## 2023-03-09 DIAGNOSIS — M9902 Segmental and somatic dysfunction of thoracic region: Secondary | ICD-10-CM | POA: Diagnosis not present

## 2023-03-09 DIAGNOSIS — M62830 Muscle spasm of back: Secondary | ICD-10-CM | POA: Diagnosis not present

## 2023-03-17 ENCOUNTER — Encounter: Payer: Self-pay | Admitting: Family Medicine

## 2023-03-31 ENCOUNTER — Other Ambulatory Visit: Payer: Self-pay | Admitting: Family Medicine

## 2023-03-31 DIAGNOSIS — I10 Essential (primary) hypertension: Secondary | ICD-10-CM

## 2023-03-31 NOTE — Telephone Encounter (Signed)
Requested Prescriptions  Pending Prescriptions Disp Refills   metoprolol succinate (TOPROL-XL) 50 MG 24 hr tablet [Pharmacy Med Name: METOPROLOL SUCC ER 50 MG TAB] 45 tablet 0    Sig: TAKE 0.5 TABLETS (25 MG TOTAL) BY MOUTH DAILY. TAKE WITH OR IMMEDIATELY FOLLOWING A MEAL.     Cardiovascular:  Beta Blockers Failed - 03/31/2023  1:31 AM      Failed - Valid encounter within last 6 months    Recent Outpatient Visits           1 month ago Encounter for annual wellness visit (AWV) in Medicare patient   [place] [facility] [place], Marzella Schlein, MD   8 months ago Prediabetes   [place] [facility] [place], Marzella Schlein, MD   1 year ago Prediabetes   [place] [facility] [place], [place], PA-C   1 year ago Acute non-recurrent frontal sinusitis   [place] [facility] Alfredia Ferguson, PA-C   1 year ago [employer] preventive visit   [facility], Marzella Schlein, MD       Future Appointments             In 4 months Bac[place], Marzella Schlein, MD [facility]            Passed - Last BP in normal range    BP Readings from Last 1 Encounters:  02/01/23 127/79         Passed - Last Heart Rate in normal range    Pulse Readings from Last 1 Encounters:  02/01/23 79

## 2023-04-01 ENCOUNTER — Other Ambulatory Visit: Payer: Self-pay | Admitting: Family Medicine

## 2023-04-01 NOTE — Telephone Encounter (Signed)
Requested Prescriptions  Pending Prescriptions Disp Refills   busPIRone (BUSPAR) 5 MG tablet [Pharmacy Med Name: BUSPIRONE HCL 5 MG TABLET] 180 tablet 1    Sig: TAKE 1 TABLET BY MOUTH TWICE A DAY     Psychiatry: Anxiolytics/Hypnotics - Non-controlled Passed - 04/01/2023  1:28 AM      Passed - Valid encounter within last 12 months    Recent Outpatient Visits           1 month ago Encounter for annual wellness visit (AWV) in Medicare patient   [place] [facility] [place], Marzella Schlein, MD   8 months ago Prediabetes   [place] [facility] [place], Marzella Schlein, MD   1 year ago Prediabetes   [place] [facility] [place], [place], PA-C   1 year ago Acute non-recurrent frontal sinusitis   [place] [facility] Alfredia Ferguson, PA-C   1 year ago [employer] preventive visit   [place] [facility] [place], Marzella Schlein, MD       Future Appointments             In 4 months Bacigalupo, Marzella Schlein, MD [facility]             lisinopril-hydrochlorothiazide (ZESTORETIC) 20-25 MG tablet [Pharmacy Med Name: LISINOPRIL-HCTZ 20-25 MG TAB] 90 tablet 1    Sig: TAKE 1 TABLET BY MOUTH EVERY DAY     Cardiovascular:  ACEI + Diuretic Combos Failed - 04/01/2023  1:28 AM      Failed - Valid encounter within last 6 months    Recent Outpatient Visits           1 month ago Encounter for annual wellness visit (AWV) in Medicare patient   [place] [facility] [place], Marzella Schlein, MD   8 months ago Prediabetes   [place] [facility] [place], Marzella Schlein, MD   1 year ago Prediabetes   [place] [facility] [place], [place], PA-C   1 year ago Acute non-recurrent frontal sinusitis   [place] [facility] Alfredia Ferguson, PA-C   1 year ago [employer] preventive visit   [place] [facility] [place], Marzella Schlein, MD       Future Appointments             In 4 months Bacigalupo, Marzella Schlein, MD [facility]            Passed - Na in normal range and within 180 days    Sodium  Date Value Ref Range Status  02/01/2023 139 134 - 144 mmol/L Final         Passed - K in normal range and within 180 days    Potassium  Date Value Ref Range Status  02/01/2023 4.4 3.5 - 5.2 mmol/L Final         Passed - Cr in normal range and within 180 days    Creat  Date Value Ref Range Status  05/10/2017 0.60 0.50 - 0.99 mg/dL Final    Comment:    For patients >91 years of age, the reference limit for Creatinine is approximately 13% higher for people identified as African-American. .    Creatinine, Ser  Date Value Ref Range Status  02/01/2023 0.74 0.57 - 1.00 mg/dL Final         Passed - eGFR is 30 or above and within 180 days  GFR, Est African American  Date Value Ref Range Status  05/10/2017 115 > OR = 60 mL/min/1.31m2 Final   GFR calc Af Amer  Date Value Ref Range Status  06/10/2020 82 >59 mL/min/1.73 Final    Comment:    **In accordance with recommendations from the NKF-ASN Task force,**   [facility] is in the process of updating its eGFR calculation to the   2021 CKD-EPI creatinine equation that estimates kidney function   without a race variable.    GFR, Est Non African American  Date Value Ref Range Status  05/10/2017 99 > OR = 60 mL/min/1.19m2 Final   GFR calc non Af Amer  Date Value Ref Range Status  06/10/2020 71 >59 mL/min/1.73 Final   eGFR  Date Value Ref Range Status  02/01/2023 89 >59 mL/min/1.73 Final         Passed - Patient is not pregnant      Passed - Last BP in normal range    BP Readings from Last 1 Encounters:  02/01/23 127/79

## 2023-04-18 ENCOUNTER — Other Ambulatory Visit: Payer: Self-pay | Admitting: Family Medicine

## 2023-04-18 DIAGNOSIS — E782 Mixed hyperlipidemia: Secondary | ICD-10-CM

## 2023-04-19 DIAGNOSIS — M62830 Muscle spasm of back: Secondary | ICD-10-CM | POA: Diagnosis not present

## 2023-04-19 DIAGNOSIS — M9901 Segmental and somatic dysfunction of cervical region: Secondary | ICD-10-CM | POA: Diagnosis not present

## 2023-04-19 DIAGNOSIS — M5412 Radiculopathy, cervical region: Secondary | ICD-10-CM | POA: Diagnosis not present

## 2023-04-19 DIAGNOSIS — M9902 Segmental and somatic dysfunction of thoracic region: Secondary | ICD-10-CM | POA: Diagnosis not present

## 2023-04-29 ENCOUNTER — Other Ambulatory Visit: Payer: Self-pay

## 2023-04-29 DIAGNOSIS — Z87891 Personal history of nicotine dependence: Secondary | ICD-10-CM

## 2023-04-29 DIAGNOSIS — Z122 Encounter for screening for malignant neoplasm of respiratory organs: Secondary | ICD-10-CM

## 2023-05-04 ENCOUNTER — Ambulatory Visit: Payer: Medicare PPO | Admitting: Acute Care

## 2023-05-04 DIAGNOSIS — Z87891 Personal history of nicotine dependence: Secondary | ICD-10-CM

## 2023-05-04 NOTE — Progress Notes (Signed)
 Virtual Visit via Telephone Note  I connected with Bonnie Duffy on 05/04/23 at  2:00 PM EST by telephone and verified that I am speaking with the correct person using two identifiers.  Location: Patient: in home Provider: [address] [place], [place], [address]    Shared Decision Making Visit Lung Cancer Screening Program [phone])   Eligibility: Age 67 y.o. Pack Years Smoking History Calculation 25 (# packs/per year x # years smoked) Recent History of coughing up blood  no Unexplained weight loss? no ( >Than 15 pounds within the last 6 months ) Prior History Lung / other cancer no (Diagnosis within the last 5 years already requiring surveillance chest CT Scans). Smoking Status Former Smoker Former Smokers: Years since quit: 5 years  Quit Date: 2019  Visit Components: Discussion included one or more decision making aids. yes Discussion included risk/benefits of screening. yes Discussion included potential follow up diagnostic testing for abnormal scans. yes Discussion included meaning and risk of over diagnosis. yes Discussion included meaning and risk of False Positives. yes Discussion included meaning of total radiation exposure. yes  Counseling Included: Importance of adherence to annual lung cancer LDCT screening. yes Impact of comorbidities on ability to participate in the program. yes Ability and willingness to under diagnostic treatment. yes  Smoking Cessation Counseling: Current Smokers:  Discussed importance of smoking cessation. yes Information about tobacco cessation classes and interventions provided to patient. yes Patient provided with "ticket" for LDCT Scan. yes Symptomatic Patient. no  Counseling NA Diagnosis Code: Tobacco Use Z72.0 Asymptomatic Patient yes  Counseling (Intermediate counseling: > three minutes counseling) W1191 Former Smokers:  Discussed the importance of maintaining cigarette abstinence. yes Diagnosis Code: Personal  History of Nicotine Dependence. Y78.295 Information about tobacco cessation classes and interventions provided to patient. Yes Patient provided with "ticket" for LDCT Scan. yes Written Order for Lung Cancer Screening with LDCT placed in Epic. Yes (CT Chest Lung Cancer Screening Low Dose W/O CM) [id number] Z12.2-Screening of respiratory organs Z87.891-Personal history of nicotine dependence   Karl Bales, RN 05/04/23

## 2023-05-04 NOTE — Patient Instructions (Signed)

## 2023-05-06 ENCOUNTER — Ambulatory Visit
Admission: RE | Admit: 2023-05-06 | Discharge: 2023-05-06 | Disposition: A | Discharge status: Home / Self Care | Payer: Medicare PPO | Source: Ambulatory Visit | Attending: Acute Care | Admitting: Acute Care

## 2023-05-06 DIAGNOSIS — F17210 Nicotine dependence, cigarettes, uncomplicated: Secondary | ICD-10-CM | POA: Diagnosis not present

## 2023-05-06 DIAGNOSIS — Z122 Encounter for screening for malignant neoplasm of respiratory organs: Secondary | ICD-10-CM | POA: Insufficient documentation

## 2023-05-06 DIAGNOSIS — Z87891 Personal history of nicotine dependence: Secondary | ICD-10-CM | POA: Insufficient documentation

## 2023-05-17 DIAGNOSIS — M9902 Segmental and somatic dysfunction of thoracic region: Secondary | ICD-10-CM | POA: Diagnosis not present

## 2023-05-17 DIAGNOSIS — M9901 Segmental and somatic dysfunction of cervical region: Secondary | ICD-10-CM | POA: Diagnosis not present

## 2023-05-17 DIAGNOSIS — M62830 Muscle spasm of back: Secondary | ICD-10-CM | POA: Diagnosis not present

## 2023-05-17 DIAGNOSIS — M5412 Radiculopathy, cervical region: Secondary | ICD-10-CM | POA: Diagnosis not present

## 2023-06-03 ENCOUNTER — Other Ambulatory Visit: Payer: Self-pay

## 2023-06-03 DIAGNOSIS — Z87891 Personal history of nicotine dependence: Secondary | ICD-10-CM

## 2023-06-03 DIAGNOSIS — Z122 Encounter for screening for malignant neoplasm of respiratory organs: Secondary | ICD-10-CM

## 2023-06-14 DIAGNOSIS — M9901 Segmental and somatic dysfunction of cervical region: Secondary | ICD-10-CM | POA: Diagnosis not present

## 2023-06-14 DIAGNOSIS — M5412 Radiculopathy, cervical region: Secondary | ICD-10-CM | POA: Diagnosis not present

## 2023-06-14 DIAGNOSIS — M62830 Muscle spasm of back: Secondary | ICD-10-CM | POA: Diagnosis not present

## 2023-06-14 DIAGNOSIS — M9902 Segmental and somatic dysfunction of thoracic region: Secondary | ICD-10-CM | POA: Diagnosis not present

## 2023-06-28 ENCOUNTER — Other Ambulatory Visit: Payer: Self-pay | Admitting: Family Medicine

## 2023-06-30 HISTORY — PX: EYE SURGERY: SHX253

## 2023-07-05 ENCOUNTER — Other Ambulatory Visit: Payer: Self-pay | Admitting: Family Medicine

## 2023-07-05 DIAGNOSIS — Z1231 Encounter for screening mammogram for malignant neoplasm of breast: Secondary | ICD-10-CM

## 2023-07-08 ENCOUNTER — Other Ambulatory Visit: Payer: Self-pay | Admitting: Family Medicine

## 2023-07-08 DIAGNOSIS — I10 Essential (primary) hypertension: Secondary | ICD-10-CM

## 2023-07-14 DIAGNOSIS — M9901 Segmental and somatic dysfunction of cervical region: Secondary | ICD-10-CM | POA: Diagnosis not present

## 2023-07-14 DIAGNOSIS — M5412 Radiculopathy, cervical region: Secondary | ICD-10-CM | POA: Diagnosis not present

## 2023-07-14 DIAGNOSIS — M62830 Muscle spasm of back: Secondary | ICD-10-CM | POA: Diagnosis not present

## 2023-07-14 DIAGNOSIS — M9902 Segmental and somatic dysfunction of thoracic region: Secondary | ICD-10-CM | POA: Diagnosis not present

## 2023-08-05 ENCOUNTER — Ambulatory Visit (INDEPENDENT_AMBULATORY_CARE_PROVIDER_SITE_OTHER): Payer: Medicare PPO | Admitting: Family Medicine

## 2023-08-05 ENCOUNTER — Encounter: Payer: Self-pay | Admitting: Family Medicine

## 2023-08-05 VITALS — BP 136/69 | HR 70 | Ht 63.5 in | Wt 217.7 lb

## 2023-08-05 DIAGNOSIS — J432 Centrilobular emphysema: Secondary | ICD-10-CM | POA: Insufficient documentation

## 2023-08-05 DIAGNOSIS — R638 Other symptoms and signs concerning food and fluid intake: Secondary | ICD-10-CM | POA: Insufficient documentation

## 2023-08-05 DIAGNOSIS — F419 Anxiety disorder, unspecified: Secondary | ICD-10-CM

## 2023-08-05 DIAGNOSIS — I10 Essential (primary) hypertension: Secondary | ICD-10-CM | POA: Diagnosis not present

## 2023-08-05 DIAGNOSIS — I700 Atherosclerosis of aorta: Secondary | ICD-10-CM | POA: Insufficient documentation

## 2023-08-05 DIAGNOSIS — R7303 Prediabetes: Secondary | ICD-10-CM

## 2023-08-05 DIAGNOSIS — E782 Mixed hyperlipidemia: Secondary | ICD-10-CM | POA: Diagnosis not present

## 2023-08-05 MED ORDER — BUPROPION HCL ER (XL) 150 MG PO TB24: 150.0000 mg | ORAL_TABLET | Freq: Every day | ORAL | 2 refills | Status: DC

## 2023-08-05 MED ORDER — LISINOPRIL-HYDROCHLOROTHIAZIDE 20-25 MG PO TABS: 0.5000 | ORAL_TABLET | Freq: Every day | ORAL | 1 refills | Status: DC

## 2023-08-05 MED ORDER — NALTREXONE HCL 50 MG PO TABS: 25.0000 mg | ORAL_TABLET | Freq: Every day | ORAL | 2 refills | Status: DC

## 2023-08-05 NOTE — Assessment & Plan Note (Signed)
Previously well controlled Continue statin Repeat FLP and CMP annually

## 2023-08-05 NOTE — Assessment & Plan Note (Signed)
 Blood pressure is well-managed with metoprolol , lisinopril  HCTZ. Emphasized the importance of regular monitoring and adherence to current medication regimen. - Continue current medications: metoprolol  XL 25 mg daily, lisinopril  HCTZ half a pill daily - Monitor blood pressure regularly

## 2023-08-05 NOTE — Assessment & Plan Note (Signed)
 Prediabetic and aims to prevent progression to diabetes. Weight loss is a key goal to manage blood sugar levels. Discussed the challenges of weight loss and the use of Wellbutrin  XL and naltrexone  to manage food cravings. Explained that Medicare does not cover Ozempic or Wegovy for prediabetes. - Order A1c test - Encourage weight loss through dietary changes and exercise

## 2023-08-05 NOTE — Assessment & Plan Note (Signed)
 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 Managed with rosuvastatin  to stabilize and prevent further plaque growth. Emphasized dietary modifications to reduce intake of high-fat foods. - Continue rosuvastatin  - Encourage dietary modifications to reduce intake of high-fat foods - Monitor cholesterol levels

## 2023-08-05 NOTE — Assessment & Plan Note (Signed)
 Chronic lung damage likely due to smoking. No current symptoms, noted on imaging. Quitting smoking has halted progression. Emphasized the importance of avoiding smoking to prevent exacerbation. - Monitor for symptoms such as wheezing or increased phlegm - Consider inhalers if symptoms develop - Encourage continued smoking cessation - Promote aerobic exercise to maintain lung strength

## 2023-08-05 NOTE — Assessment & Plan Note (Signed)
 BMI 38 and assoc with HTN, HLD, prediabetes Discussed importance of healthy weight management Discussed diet and exercise  Prescribe Wellbutrin  XL 150 mg daily and naltrexone  25 mg daily to manage food cravings Can consider topamax in the future

## 2023-08-05 NOTE — Assessment & Plan Note (Signed)
 Managed with Buspar  and occasional use of Xanax . No recent increase in anxiety symptoms. Emphasized the importance of using Xanax  sparingly. - Continue Buspar  5 mg twice daily - Use Xanax  0.25 mg sparingly as needed

## 2023-08-05 NOTE — Progress Notes (Signed)
 Established patient visit   Patient: Bonnie Duffy   DOB: 11-13-1955   68 y.o. Female  MRN: [id number] Visit Date: 08/05/2023  Today's healthcare provider: Jon Eva, MD   Chief Complaint  Patient presents with   Medical Management of Chronic Issues    6 month follow-up    Hypertension    Patient reports tolerating rx well, she does not monitor at home with no symptoms to report.   Results    Patient would like to discuss lung scan completed on 05/06/23.    Subjective    HPI HPI     Medical Management of Chronic Issues    Additional comments: 6 month follow-up         Hypertension    Additional comments: Patient reports tolerating rx well, she does not monitor at home with no symptoms to report.        Results    Additional comments: Patient would like to discuss lung scan completed on 05/06/23.       Last edited by Lilian Fitzpatrick, CMA on 08/05/2023  8:54 AM.       Discussed the use of AI scribe software for clinical note transcription with the patient, who gave verbal consent to proceed.  History of Present Illness   The patient, with a history of prediabetes, hypertension, and anxiety, presents with concerns about her recent lung scan results. The scan showed spots, emphysema, and aortic atherosclerosis. The patient is particularly concerned about the emphysema and aortic atherosclerosis findings. She reports no symptoms of emphysema such as wheezing or coughing up thick phlegm. She is a former smoker and has quit smoking, which she acknowledges has likely contributed to the lung damage.  The patient also discusses her struggle with weight loss and sugar cravings. Despite trying Weight Watchers and eliminating processed foods and sugar from her diet, she has had difficulty losing weight. 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 She expresses frustration  with her inability to control her cravings and her concern about the impact of her diet on her blood sugar levels and overall health.         Medications: Outpatient Medications Prior to Visit  Medication Sig   ALPRAZolam  (XANAX ) 0.25 MG tablet Take 0.5 tablets (0.125 mg total) by mouth 2 (two) times daily as needed for anxiety.   benzonatate  (TESSALON ) 100 MG capsule Take 1 capsule (100 mg total) by mouth 3 (three) times daily as needed for cough.   busPIRone  (BUSPAR ) 5 MG tablet TAKE 1 TABLET BY MOUTH TWICE A DAY   fenofibrate  (TRICOR ) 145 MG tablet TAKE 1 TABLET (145 MG TOTAL) DAILY BY MOUTH.   fesoterodine  (TOVIAZ ) 8 MG TB24 tablet TAKE 1 TABLET (8 MG TOTAL) DAILY BY MOUTH.   fexofenadine (ALLEGRA) 180 MG tablet Take by mouth.   fluticasone  (FLONASE ) 50 MCG/ACT nasal spray SPRAY 2 SPRAYS INTO EACH NOSTRIL EVERY DAY   magnesium 30 MG tablet Take by mouth 1 day or 1 dose.   metoprolol  succinate (TOPROL -XL) 50 MG 24 hr tablet TAKE 0.5 TABLETS (25 MG TOTAL) BY MOUTH DAILY. TAKE WITH OR IMMEDIATELY FOLLOWING A MEAL.   Multiple Vitamins-Minerals (MULTIVITAMIN PO) Take by mouth.   Omega-3 Fatty Acids (FISH OIL) 1200 MG CAPS Take by mouth.   rosuvastatin  (CRESTOR ) 10 MG tablet TAKE 1 TABLET BY MOUTH EVERY DAY   Turmeric (QC TUMERIC COMPLEX PO) Take by  mouth daily.   vitamin C (ASCORBIC ACID) 500 MG tablet Take 500 mg daily by mouth.   Vitamin D , Ergocalciferol , 2000 units CAPS Take by mouth.   [DISCONTINUED] lisinopril -hydrochlorothiazide  (ZESTORETIC ) 20-25 MG tablet TAKE 1 TABLET BY MOUTH EVERY DAY   No facility-administered medications prior to visit.    Review of Systems     Objective    BP 136/69 (BP Location: Left Arm, Patient Position: Sitting, Cuff Size: Large)   Pulse 70   Ht 5' 3.5 (1.613 m)   Wt 217 lb 11.2 oz (98.7 kg)   SpO2 98%   BMI 37.96 kg/m    Physical Exam Vitals reviewed.  Constitutional:      General: She is not in acute distress.    Appearance: Normal  appearance. She is well-developed. She is not diaphoretic.  HENT:     Head: Normocephalic and atraumatic.  Eyes:     General: No scleral icterus.    Conjunctiva/sclera: Conjunctivae normal.  Neck:     Thyroid : No thyromegaly.  Cardiovascular:     Rate and Rhythm: Normal rate and regular rhythm.     Heart sounds: Normal heart sounds. No murmur heard. Pulmonary:     Effort: Pulmonary effort is normal. No respiratory distress.     Breath sounds: Normal breath sounds. No wheezing, rhonchi or rales.  Musculoskeletal:     Cervical back: Neck supple.     Right lower leg: No edema.     Left lower leg: No edema.  Lymphadenopathy:     Cervical: No cervical adenopathy.  Skin:    General: Skin is warm and dry.     Findings: No rash.  Neurological:     Mental Status: She is alert and oriented to person, place, and time. Mental status is at baseline.  Psychiatric:        Mood and Affect: Mood normal.        Behavior: Behavior normal.      No results found for any visits on 08/05/23.  Assessment & Plan     Problem List Items Addressed This Visit       Cardiovascular and Mediastinum   Essential hypertension - Primary   Blood pressure is well-managed with metoprolol , lisinopril  HCTZ. Emphasized the importance of regular monitoring and adherence to current medication regimen. - Continue current medications: metoprolol  XL 25 mg daily, lisinopril  HCTZ half a pill daily - Monitor blood pressure regularly      Relevant Medications   lisinopril -hydrochlorothiazide  (ZESTORETIC ) 20-25 MG tablet   Other Relevant Orders   Basic Metabolic Panel (BMET)   Aortic atherosclerosis (HCC)   Presence of plaques in the aorta, common in current or former smokers. Managed with rosuvastatin  to stabilize and prevent further plaque growth. Emphasized dietary modifications to reduce intake of high-fat foods. - Continue rosuvastatin  - Encourage dietary modifications to reduce intake of high-fat foods -  Monitor cholesterol levels      Relevant Medications   lisinopril -hydrochlorothiazide  (ZESTORETIC ) 20-25 MG tablet     Respiratory   Centrilobular emphysema (HCC)   Chronic lung damage likely due to smoking. No current symptoms, noted on imaging. Quitting smoking has halted progression. Emphasized the importance of avoiding smoking to prevent exacerbation. - Monitor for symptoms such as wheezing or increased phlegm - Consider inhalers if symptoms develop - Encourage continued smoking cessation - Promote aerobic exercise to maintain lung strength        Other   Anxiety   Managed with Buspar  and occasional use of Xanax .  No recent increase in anxiety symptoms. Emphasized the importance of using Xanax  sparingly. - Continue Buspar  5 mg twice daily - Use Xanax  0.25 mg sparingly as needed      Relevant Medications   buPROPion  (WELLBUTRIN  XL) 150 MG 24 hr tablet   Hyperlipidemia   Previously well controlled Continue statin Repeat FLP and CMP annually      Relevant Medications   lisinopril -hydrochlorothiazide  (ZESTORETIC ) 20-25 MG tablet   Prediabetes   Prediabetic and aims to prevent progression to diabetes. Weight loss is a key goal to manage blood sugar levels. Discussed the challenges of weight loss and the use of Wellbutrin  XL and naltrexone  to manage food cravings. Explained that Medicare does not cover Ozempic or Wegovy for prediabetes. - Order A1c test - Encourage weight loss through dietary changes and exercise      Relevant Orders   Hemoglobin A1c   Morbid obesity (HCC)   BMI 38 and assoc with HTN, HLD, prediabetes Discussed importance of healthy weight management Discussed diet and exercise  Prescribe Wellbutrin  XL 150 mg daily and naltrexone  25 mg daily to manage food cravings Can consider topamax in the future      Craving for particular food       Follow-up - Schedule follow-up appointment in 3 months to assess weight loss and medication efficacy - Schedule  routine physical in 6 months.        Return in about 3 months (around 11/02/2023) for weight f/u.       Jon Eva, MD  [facility] [phone] (phone) [phone] (fax)  [facility]

## 2023-08-06 ENCOUNTER — Encounter: Payer: Self-pay | Admitting: Family Medicine

## 2023-08-06 LAB — BASIC METABOLIC PANEL
BUN/Creatinine Ratio: 19 (ref 12–28)
BUN: 13 mg/dL (ref 8–27)
CO2: 19 mmol/L — ABNORMAL LOW (ref 20–29)
Calcium: 10.1 mg/dL (ref 8.7–10.3)
Chloride: 105 mmol/L (ref 96–106)
Creatinine, Ser: 0.69 mg/dL (ref 0.57–1.00)
Glucose: 116 mg/dL — ABNORMAL HIGH (ref 70–99)
Potassium: 4.7 mmol/L (ref 3.5–5.2)
Sodium: 145 mmol/L — ABNORMAL HIGH (ref 134–144)
eGFR: 95 mL/min/{1.73_m2} (ref >59)

## 2023-08-06 LAB — HEMOGLOBIN A1C
Est. average glucose Bld gHb Est-mCnc: 126 mg/dL
Hgb A1c MFr Bld: 6 % — ABNORMAL HIGH (ref 4.8–5.6)

## 2023-08-11 DIAGNOSIS — M5412 Radiculopathy, cervical region: Secondary | ICD-10-CM | POA: Diagnosis not present

## 2023-08-11 DIAGNOSIS — M62830 Muscle spasm of back: Secondary | ICD-10-CM | POA: Diagnosis not present

## 2023-08-11 DIAGNOSIS — M9901 Segmental and somatic dysfunction of cervical region: Secondary | ICD-10-CM | POA: Diagnosis not present

## 2023-08-11 DIAGNOSIS — M9902 Segmental and somatic dysfunction of thoracic region: Secondary | ICD-10-CM | POA: Diagnosis not present

## 2023-08-20 ENCOUNTER — Ambulatory Visit
Admission: RE | Admit: 2023-08-20 | Discharge: 2023-08-20 | Disposition: A | Discharge status: Home / Self Care | Payer: Medicare PPO | Source: Ambulatory Visit | Attending: Family Medicine | Admitting: Family Medicine

## 2023-08-20 DIAGNOSIS — Z1231 Encounter for screening mammogram for malignant neoplasm of breast: Secondary | ICD-10-CM | POA: Insufficient documentation

## 2023-08-24 ENCOUNTER — Encounter: Payer: Self-pay | Admitting: Family Medicine

## 2023-08-27 ENCOUNTER — Other Ambulatory Visit: Payer: Self-pay | Admitting: Family Medicine

## 2023-08-30 NOTE — Telephone Encounter (Signed)
 Requested Prescriptions  Pending Prescriptions Disp Refills   buPROPion (WELLBUTRIN XL) 150 MG 24 hr tablet [Pharmacy Med Name: BUPROPION HCL XL 150 MG TABLET] 90 tablet 0    Sig: TAKE 1 TABLET BY MOUTH EVERY DAY     Psychiatry: Antidepressants - bupropion Failed - 08/30/2023 12:05 PM      Failed - AST in normal range and within 360 days    AST  Date Value Ref Range Status  02/01/2023 43 (H) 0 - 40 IU/L Final         Failed - ALT in normal range and within 360 days    ALT  Date Value Ref Range Status  02/01/2023 72 (H) 0 - 32 IU/L Final         Failed - Valid encounter within last 6 months    Recent Outpatient Visits           7 months ago Encounter for annual wellness visit (AWV) in Medicare patient   [place] [facility] [place], Marzella Schlein, MD   1 year ago Prediabetes   [place] [facility] [place], Marzella Schlein, MD   1 year ago Prediabetes   [place] [facility] [place], [place], PA-C   1 year ago Acute non-recurrent frontal sinusitis   [place] [facility] Alfredia Ferguson, PA-C   2 years ago [employer] to [employer] preventive visit   [facility] [place], Marzella Schlein, MD       Future Appointments             In 2 months Bacigalupo, Marzella Schlein, MD [facility]            Passed - Cr in normal range and within 360 days    Creat  Date Value Ref Range Status  05/10/2017 0.60 0.50 - 0.99 mg/dL Final    Comment:    For patients >52 years of age, the reference limit for Creatinine is approximately 13% higher for people identified as African-American. .    Creatinine, Ser  Date Value Ref Range Status  08/05/2023 0.69 0.57 - 1.00 mg/dL Final         Passed - Last BP in normal range    BP Readings from Last 1 Encounters:  08/05/23 136/69

## 2023-10-04 ENCOUNTER — Other Ambulatory Visit: Payer: Self-pay | Admitting: Family Medicine

## 2023-10-07 ENCOUNTER — Other Ambulatory Visit: Payer: Self-pay | Admitting: Family Medicine

## 2023-10-07 DIAGNOSIS — I10 Essential (primary) hypertension: Secondary | ICD-10-CM

## 2023-10-07 NOTE — Telephone Encounter (Signed)
 Requested Prescriptions  Pending Prescriptions Disp Refills   metoprolol succinate (TOPROL-XL) 50 MG 24 hr tablet [Pharmacy Med Name: METOPROLOL SUCC ER 50 MG TAB] 45 tablet 0    Sig: TAKE 0.5 TABLETS (25 MG TOTAL) BY MOUTH DAILY. TAKE WITH OR IMMEDIATELY FOLLOWING A MEAL.     Cardiovascular:  Beta Blockers Passed - 10/07/2023  2:03 PM      Passed - Last BP in normal range    BP Readings from Last 1 Encounters:  08/05/23 136/69         Passed - Last Heart Rate in normal range    Pulse Readings from Last 1 Encounters:  08/05/23 70         Passed - Valid encounter within last 6 months    Recent Outpatient Visits           2 months ago Essential hypertension   [place] [facility] [place], Marzella Schlein, MD       Future Appointments             In 3 weeks Bacigalupo, Marzella Schlein, MD [facility]

## 2023-10-30 ENCOUNTER — Other Ambulatory Visit: Payer: Self-pay | Admitting: Family Medicine

## 2023-11-02 ENCOUNTER — Encounter: Payer: Self-pay | Admitting: Family Medicine

## 2023-11-02 ENCOUNTER — Ambulatory Visit: Payer: Medicare PPO | Admitting: Family Medicine

## 2023-11-02 MED ORDER — NALTREXONE HCL 50 MG PO TABS: 25.0000 mg | ORAL_TABLET | Freq: Every day | ORAL | 1 refills | Status: DC

## 2023-11-02 MED ORDER — BUPROPION HCL ER (XL) 150 MG PO TB24: 150.0000 mg | ORAL_TABLET | Freq: Every day | ORAL | 1 refills | Status: DC

## 2023-11-02 NOTE — Assessment & Plan Note (Signed)
 Obesity management with Wellbutrin  and naltrexone  combination therapy. Reports a weight loss of approximately 11 pounds over the past three months, averaging about a pound per week. Following a diet focused on reducing processed foods and sugar intake, which has also helped alleviate hip bursitis symptoms. Satisfied with the current dose of Wellbutrin  (150 mg) and prefers not to increase it at this time. Practicing mindful eating and has support from her husband, who is also managing his diet. The current regimen is effective in controlling cravings and promoting sustainable weight loss. - Continue Wellbutrin  XL 150 mg daily. - Continue naltrexone  25 mg daily. - Ref[facility] 90-day supply [facility] at [facility]. - Encourage continued dietary modifications focusing on reducing processed foods and sugar. - Reassess weight management and medication dosage at the next visit in August.

## 2023-11-02 NOTE — Progress Notes (Signed)
 Established patient visit   Patient: Bonnie Duffy   DOB: [birth year]   68 y.o. Female  MRN: [id number] Visit Date: 11/02/2023  Today's healthcare provider: Aden Agreste, MD   Chief Complaint  Patient presents with   Weight Check    Pt reports taking medications as prescribed and tolerating well.    Obesity   Subjective    HPI HPI     Weight Check    Additional comments: Pt reports taking medications as prescribed and tolerating well.       Last edited by Pasty Bongo, CMA on 11/02/2023  2:12 PM.       Discussed the use of AI scribe software for clinical note transcription with the patient, who gave verbal consent to proceed.  History of Present Illness   Bonnie Duffy is a 68 year old female who presents for follow-up on weight management and medication adjustment.  She has been taking Wellbutrin  and naltrexone  for three months for weight management. Initially, naltrexone  caused nausea, but this has resolved. The medication effectively reduces cravings, especially for sugary foods. She has lost approximately eleven pounds since her last visit, averaging about a pound per week.  She has reduced processed foods and sugar in her diet, which has alleviated hip bursitis symptoms. During a recent vacation, processed foods triggered hip pain, reinforcing her dietary changes. Her husband, on [employer] for diabetes, positively influences her eating habits.  She uses the [employer] app for meal planning, which aids in making healthier food choices. She has reduced her sodium intake after noticing elevated levels in past lab results. She prefers to maintain her current medication dosage to avoid mental clarity issues.         Medications: Outpatient Medications Prior to Visit  Medication Sig   ALPRAZolam  (XANAX ) 0.25 MG tablet Take 0.5 tablets (0.125 mg total) by mouth 2 (two) times daily as needed for anxiety.   busPIRone  (BUSPAR ) 5 MG tablet TAKE  1 TABLET BY MOUTH TWICE A DAY   fenofibrate  (TRICOR ) 145 MG tablet TAKE 1 TABLET (145 MG TOTAL) DAILY BY MOUTH.   fesoterodine  (TOVIAZ ) 8 MG TB24 tablet TAKE 1 TABLET (8 MG TOTAL) DAILY BY MOUTH.   fexofenadine (ALLEGRA) 180 MG tablet Take by mouth.   fluticasone  (FLONASE ) 50 MCG/ACT nasal spray SPRAY 2 SPRAYS INTO EACH NOSTRIL EVERY DAY   lisinopril -hydrochlorothiazide  (ZESTORETIC ) 20-25 MG tablet Take 0.5 tablets by mouth daily.   magnesium 30 MG tablet Take by mouth 1 day or 1 dose.   metoprolol  succinate (TOPROL -XL) 50 MG 24 hr tablet TAKE 0.5 TABLETS (25 MG TOTAL) BY MOUTH DAILY. TAKE WITH OR IMMEDIATELY FOLLOWING A MEAL.   Multiple Vitamins-Minerals (MULTIVITAMIN PO) Take by mouth.   Omega-3 Fatty Acids (FISH OIL) 1200 MG CAPS Take by mouth.   rosuvastatin  (CRESTOR ) 10 MG tablet TAKE 1 TABLET BY MOUTH EVERY DAY   Turmeric (QC TUMERIC COMPLEX PO) Take by mouth daily.   vitamin C (ASCORBIC ACID) 500 MG tablet Take 500 mg daily by mouth.   Vitamin D , Ergocalciferol , 2000 units CAPS Take by mouth.   [DISCONTINUED] buPROPion  (WELLBUTRIN  XL) 150 MG 24 hr tablet TAKE 1 TABLET BY MOUTH EVERY DAY   [DISCONTINUED] naltrexone  (DEPADE) 50 MG tablet TAKE 1/2 TABLET BY MOUTH DAILY   [DISCONTINUED] benzonatate  (TESSALON ) 100 MG capsule Take 1 capsule (100 mg total) by mouth 3 (three) times daily as needed for cough.   No facility-administered medications prior to visit.  Review of Systems     Objective    BP 115/80 (BP Location: Left Arm, Patient Position: Sitting, Cuff Size: Large)   Pulse 83   Ht 5\' 3"  (1.6 m)   Wt 206 lb 14.4 oz (93.8 kg)   BMI 36.65 kg/m    Physical Exam Vitals reviewed.  Constitutional:      General: She is not in acute distress.    Appearance: Normal appearance. She is well-developed. She is not diaphoretic.  HENT:     Head: Normocephalic and atraumatic.  Eyes:     General: No scleral icterus.    Conjunctiva/sclera: Conjunctivae normal.  Neck:     Thyroid :  No thyromegaly.  Cardiovascular:     Rate and Rhythm: Normal rate and regular rhythm.     Heart sounds: Normal heart sounds. No murmur heard. Pulmonary:     Effort: Pulmonary effort is normal. No respiratory distress.     Breath sounds: Normal breath sounds. No wheezing, rhonchi or rales.  Musculoskeletal:     Cervical back: Neck supple.     Right lower leg: No edema.     Left lower leg: No edema.  Lymphadenopathy:     Cervical: No cervical adenopathy.  Skin:    General: Skin is warm and dry.  Neurological:     Mental Status: She is alert and oriented to person, place, and time. Mental status is at baseline.  Psychiatric:        Mood and Affect: Mood normal.        Behavior: Behavior normal.      No results found for any visits on 11/02/23.  Assessment & Plan     Problem List Items Addressed This Visit       Other   Morbid obesity (HCC) - Primary   Obesity management with Wellbutrin  and naltrexone  combination therapy. Reports a weight loss of approximately 11 pounds over the past three months, averaging about a pound per week. Following a diet focused on reducing processed foods and sugar intake, which has also helped alleviate hip bursitis symptoms. Satisfied with the current dose of Wellbutrin  (150 mg) and prefers not to increase it at this time. Practicing mindful eating and has support from her husband, who is also managing his diet. The current regimen is effective in controlling cravings and promoting sustainable weight loss. - Continue Wellbutrin  XL 150 mg daily. - Continue naltrexone  25 mg daily. - [facility] - Encourage continued dietary modifications focusing on reducing processed foods and sugar. - Reassess weight management and medication dosage at the next visit in August.           Hip bursitis Hip bursitis symptoms have improved with dietary changes, specifically reducing processed foods and sugar intake. Reports that  her hip no longer bothers her unless she consumes processed foods, which she has identified as a trigger for inflammation. - Encourage continued dietary modifications to manage hip bursitis symptoms. - Reassess symptoms at the next visit in August.       Return in about 3 months (around 02/02/2024) for CPE, AWV.       Aden Agreste, MD  [facility] [phone] (phone) [phone] (fax)  [facility]

## 2023-12-26 ENCOUNTER — Encounter: Payer: Self-pay | Admitting: Family Medicine

## 2023-12-28 MED ORDER — BUPROPION HCL ER (XL) 300 MG PO TB24: 300.0000 mg | ORAL_TABLET | Freq: Every day | ORAL | 1 refills | Status: DC

## 2023-12-28 NOTE — Telephone Encounter (Signed)
 Increase Wellbutrin  to XL 300mg  daily #90 r1.  She can take 2 of the 150s until she runs out.

## 2024-01-05 ENCOUNTER — Other Ambulatory Visit: Payer: Self-pay | Admitting: Family Medicine

## 2024-01-05 DIAGNOSIS — E782 Mixed hyperlipidemia: Secondary | ICD-10-CM

## 2024-01-05 DIAGNOSIS — I10 Essential (primary) hypertension: Secondary | ICD-10-CM

## 2024-02-03 ENCOUNTER — Ambulatory Visit (INDEPENDENT_AMBULATORY_CARE_PROVIDER_SITE_OTHER): Admitting: Family Medicine

## 2024-02-03 ENCOUNTER — Encounter: Payer: Self-pay | Admitting: Family Medicine

## 2024-02-03 VITALS — BP 125/74 | HR 68 | Temp 97.9°F | Ht 63.0 in | Wt 201.9 lb

## 2024-02-03 DIAGNOSIS — Z0000 Encounter for general adult medical examination without abnormal findings: Secondary | ICD-10-CM

## 2024-02-03 DIAGNOSIS — Z0001 Encounter for general adult medical examination with abnormal findings: Secondary | ICD-10-CM | POA: Diagnosis not present

## 2024-02-03 DIAGNOSIS — E559 Vitamin D deficiency, unspecified: Secondary | ICD-10-CM | POA: Diagnosis not present

## 2024-02-03 DIAGNOSIS — E782 Mixed hyperlipidemia: Secondary | ICD-10-CM

## 2024-02-03 DIAGNOSIS — R7303 Prediabetes: Secondary | ICD-10-CM

## 2024-02-03 DIAGNOSIS — Z1211 Encounter for screening for malignant neoplasm of colon: Secondary | ICD-10-CM

## 2024-02-03 DIAGNOSIS — F419 Anxiety disorder, unspecified: Secondary | ICD-10-CM

## 2024-02-03 DIAGNOSIS — I10 Essential (primary) hypertension: Secondary | ICD-10-CM

## 2024-02-03 MED ORDER — BUPROPION HCL ER (XL) 150 MG PO TB24
150.0000 mg | ORAL_TABLET | Freq: Every day | ORAL | 1 refills | Status: AC
Start: 2024-02-03 — End: ?

## 2024-02-03 NOTE — Assessment & Plan Note (Signed)
 Blood pressure is well-controlled. She has an excess supply of blood pressure medication due to automatic refills.

## 2024-02-03 NOTE — Assessment & Plan Note (Signed)
 Previously increased Wellbutrin  to 300 mg for weight loss but experienced side effects including anxiety, constipation, and internal shaking. These symptoms resolved upon returning to 150 mg. Reports doing well on the current dose of 150 mg with no anxiety issues. - Adjust Wellbutrin  prescription to 150 mg - [facility]

## 2024-02-03 NOTE — Assessment & Plan Note (Signed)
 Recommend low carb diet Recheck A1c

## 2024-02-03 NOTE — Assessment & Plan Note (Signed)
Previously well controlled Continue statin Repeat FLP and CMP annually

## 2024-02-03 NOTE — Assessment & Plan Note (Signed)
 Has lost 16 pounds in the last six months, attributed to dietary changes and reduced snacking. She is focusing on eating non-processed, healthy foods and not worrying about calorie counting.

## 2024-02-03 NOTE — Progress Notes (Signed)
 Annual Wellness Visit     Patient: Bonnie Duffy, Female    DOB: 04-Jul-1955, 68 y.o.   MRN: [id number] Visit Date: 02/03/2024  Today's Provider: Jon Eva, MD   Chief Complaint  Patient presents with   Annual Exam    Diet: Low in processed foods, adds more fruits and veggies Exercise: 4 hours per week, does chair yoga, tai chi, and strength training Sleep: Has some issues staying asleep, has been going on for years  Mentioned she did change her Wellbutrin  dose from 300 back to 150- states the 300mg  dose was worsening her anxiety Overall feeling: well Concerns: none Screenings: order pended for cologuard     Subjective    Bonnie Duffy is a 68 y.o. female who presents today for her Annual Wellness Visit.  Discussed the use of AI scribe software for clinical note transcription with the patient, who gave verbal consent to proceed.  History of Present Illness   Bonnie Duffy is a 68 year old female who presents for an annual physical exam.  She increased her Wellbutrin  dosage to 300 mg but experienced anxiety, constipation, and a sensation of internal shaking. After five weeks, she returned to 150 mg, which resolved these symptoms.  She has lost 16 pounds over the past six months due to dietary changes, eating half of her usual portions, avoiding snacks, and focusing on non-processed, healthy foods.  Her mammogram is current as of February, and she completed a lung cancer screening last November. She is due for a Cologuard test, as it has been three years since her last one.  She inquires about the flu shot and COVID booster, as she plans to go on a cruise in mid-September and previously contracted COVID on a cruise.  She has an excess supply of blood pressure medication due to automatic refills, despite regular use. No abdominal pain or swelling.             Medications: Outpatient Medications Prior to Visit  Medication Sig    ALPRAZolam  (XANAX ) 0.25 MG tablet Take 0.5 tablets (0.125 mg total) by mouth 2 (two) times daily as needed for anxiety.   busPIRone  (BUSPAR ) 5 MG tablet TAKE 1 TABLET BY MOUTH TWICE A DAY   fenofibrate  (TRICOR ) 145 MG tablet TAKE 1 TABLET (145 MG TOTAL) DAILY BY MOUTH.   fesoterodine  (TOVIAZ ) 8 MG TB24 tablet TAKE 1 TABLET (8 MG TOTAL) DAILY BY MOUTH.   fexofenadine (ALLEGRA) 180 MG tablet Take by mouth.   fluticasone  (FLONASE ) 50 MCG/ACT nasal spray SPRAY 2 SPRAYS INTO EACH NOSTRIL EVERY DAY   lisinopril -hydrochlorothiazide  (ZESTORETIC ) 20-25 MG tablet Take 0.5 tablets by mouth daily.   magnesium 30 MG tablet Take by mouth 1 day or 1 dose.   metoprolol  succinate (TOPROL -XL) 50 MG 24 hr tablet TAKE 0.5 TABLETS (25 MG TOTAL) BY MOUTH DAILY. TAKE WITH OR IMMEDIATELY FOLLOWING A MEAL.   Multiple Vitamins-Minerals (MULTIVITAMIN PO) Take by mouth.   naltrexone  (DEPADE) 50 MG tablet Take 0.5 tablets (25 mg total) by mouth daily.   Omega-3 Fatty Acids (FISH OIL) 1200 MG CAPS Take by mouth.   rosuvastatin  (CRESTOR ) 10 MG tablet TAKE 1 TABLET BY MOUTH EVERY DAY   Turmeric (QC TUMERIC COMPLEX PO) Take by mouth daily.   vitamin C (ASCORBIC ACID) 500 MG tablet Take 500 mg daily by mouth.   Vitamin D , Ergocalciferol , 2000 units CAPS Take by mouth.   [DISCONTINUED] buPROPion  (WELLBUTRIN  XL) 300 MG 24 hr tablet Take  1 tablet (300 mg total) by mouth daily. Take 1 tablet (150 mg total) by mouth daily. (Patient taking differently: Take 1 tablet (300 mg total) by mouth daily. Take 1 tablet (150 mg total) by mouth daily.)   No facility-administered medications prior to visit.    Allergies  Allergen Reactions   Penicillins     No reactions noted.    Sulfamethoxazole Rash    Patient Care Team: Myrla Jon HERO, MD as PCP - General (Family Medicine)  Review of Systems       Objective    Vitals: BP 125/74 (BP Location: Left Arm, Patient Position: Sitting, Cuff Size: Normal)   Pulse 68   Temp 97.9  F (36.6 C) (Oral)   Ht 5' 3 (1.6 m)   Wt 201 lb 14.4 oz (91.6 kg)   SpO2 98%   BMI 35.76 kg/m      Physical Exam Vitals reviewed.  Constitutional:      General: She is not in acute distress.    Appearance: Normal appearance. She is well-developed. She is not diaphoretic.  HENT:     Head: Normocephalic and atraumatic.     Right Ear: Tympanic membrane, ear canal and external ear normal.     Left Ear: Tympanic membrane, ear canal and external ear normal.     Nose: Nose normal.     Mouth/Throat:     Mouth: Mucous membranes are moist.     Pharynx: Oropharynx is clear. No oropharyngeal exudate.  Eyes:     General: No scleral icterus.    Conjunctiva/sclera: Conjunctivae normal.     Pupils: Pupils are equal, round, and reactive to light.  Neck:     Thyroid : No thyromegaly.  Cardiovascular:     Rate and Rhythm: Normal rate and regular rhythm.     Heart sounds: Normal heart sounds. No murmur heard. Pulmonary:     Effort: Pulmonary effort is normal. No respiratory distress.     Breath sounds: Normal breath sounds. No wheezing or rales.  Abdominal:     General: There is no distension.     Palpations: Abdomen is soft.     Tenderness: There is no abdominal tenderness.  Musculoskeletal:        General: No deformity.     Cervical back: Neck supple.     Right lower leg: No edema.     Left lower leg: No edema.  Lymphadenopathy:     Cervical: No cervical adenopathy.  Skin:    General: Skin is warm and dry.     Findings: No rash.  Neurological:     Mental Status: She is alert and oriented to person, place, and time. Mental status is at baseline.     Gait: Gait normal.  Psychiatric:        Mood and Affect: Mood normal.        Behavior: Behavior normal.        Thought Content: Thought content normal.     Most recent functional status assessment:    02/01/2024    9:18 AM  In your present state of health, do you have any difficulty performing the following activities:   Hearing? 0  Vision? 0  Difficulty concentrating or making decisions? 0  Walking or climbing stairs? 0  Dressing or bathing? 0  Doing errands, shopping? 0  Preparing Food and eating ? N  Using the Toilet? N  In the past six months, have you accidently leaked urine? N  Do you have problems with  loss of bowel control? N  Managing your Medications? N  Managing your Finances? N  Housekeeping or managing your Housekeeping? N   Most recent fall risk assessment:    02/01/2024    9:18 AM  Fall Risk   Falls in the past year? 0    Most recent depression screenings:    02/03/2024    9:09 AM 08/05/2023    8:54 AM  PHQ 2/9 Scores  PHQ - 2 Score 0 0  PHQ- 9 Score 1 3   Most recent cognitive screening:    07/29/2021   10:06 AM  6CIT Screen  What Year? 0 points  What month? 0 points  What time? 0 points  Count back from 20 0 points  Months in reverse 0 points  Repeat phrase 2 points  Total Score 2 points   Most recent Audit-C alcohol use screening    02/01/2024    9:21 AM  Alcohol Use Disorder Test (AUDIT)  1. How often do you have a drink containing alcohol? 1  2. How many drinks containing alcohol do you have on a typical day when you are drinking? 0  3. How often do you have six or more drinks on one occasion? 0  AUDIT-C Score 1      Patient-reported   A score of 3 or more in women, and 4 or more in men indicates increased risk for alcohol abuse, EXCEPT if all of the points are from question 1   No results found.  No results found for any visits on 02/03/24.  Assessment & Plan     Annual wellness visit done today including the all of the following: Reviewed patient's Family Medical History Reviewed and updated list of patient's medical providers Assessment of cognitive impairment was done Assessed patient's functional ability Established a written schedule for health screening services Health Risk Assessent Completed and Reviewed  Exercise Activities and Dietary  recommendations  Goals   None     Immunization History  Administered Date(s) Administered    sv, Bivalent, Protein Subunit Rsvpref,pf (Abrysvo) 06/10/2023   Fluad Quad(high Dose 65+) 04/10/2022   Hepatitis B 10/03/1992, 11/04/1992, 04/24/1993   Influenza Inj Mdck Quad Pf 03/18/2021   Influenza, High Dose Seasonal PF 03/16/2023   Influenza,inj,Quad PF,6+ Mos 02/26/2016, 03/29/2017, 03/02/2018, 03/15/2020   Influenza,inj,quad, With Preservative 03/10/2010   Influenza-Unspecified 04/24/2013, 02/26/2016, 03/10/2019, 03/16/2023   PFIZER(Purple Top)SARS-COV-2 Vaccination 09/19/2019, 10/10/2019, 06/10/2020   PNEUMOCOCCAL CONJUGATE-20 07/29/2021   Pfizer Covid-19 Vaccine Bivalent Booster 49yrs & up 03/18/2021, 04/10/2022   Pfizer(Comirnaty)Fall Seasonal Vaccine 12 years and older 04/10/2022, 03/16/2023   Pneumococcal Polysaccharide-23 03/13/2012   Td 06/09/2019   Tdap 10/17/2008   Zoster Recombinant(Shingrix ) 12/15/2018, 06/09/2019    Health Maintenance  Topic Date Due   Fecal DNA (Cologuard)  01/14/2024   COVID-19 Vaccine (7 - 2024-25 season) 02/19/2024 (Originally 09/13/2023)   INFLUENZA VACCINE  09/26/2024 (Originally 01/28/2024)   Lung Cancer Screening  05/05/2024   Medicare Annual Wellness (AWV)  02/02/2025   MAMMOGRAM  08/19/2025   DTaP/Tdap/Td (3 - Td or Tdap) 06/08/2029   Pneumococcal Vaccine: 50+ Years  Completed   Hepatitis B Vaccines  Completed   DEXA SCAN  Completed   Hepatitis C Screening  Completed   Zoster Vaccines- Shingrix   Completed   HPV VACCINES  Aged Out   Meningococcal B Vaccine  Aged Out     Discussed health benefits of physical activity, and encouraged her to engage in regular exercise appropriate for her age and  condition.    Problem List Items Addressed This Visit       Cardiovascular and Mediastinum   Essential hypertension   Blood pressure is well-controlled. She has an excess supply of blood pressure medication due to automatic refills.       Relevant Orders   Comprehensive metabolic panel with GFR     Other   Anxiety   Previously increased Wellbutrin  to 300 mg for weight loss but experienced side effects including anxiety, constipation, and internal shaking. These symptoms resolved upon returning to 150 mg. Reports doing well on the current dose of 150 mg with no anxiety issues. - Adjust Wellbutrin  prescription to 150 mg - [facility]      Relevant Medications   buPROPion  (WELLBUTRIN  XL) 150 MG 24 hr tablet   Hyperlipidemia   Previously well controlled Continue statin Repeat FLP and CMP annually      Relevant Orders   Comprehensive metabolic panel with GFR   Lipid panel   Prediabetes   Recommend low carb diet Recheck A1c       Relevant Orders   Hemoglobin A1c   Avitaminosis D   Relevant Orders   VITAMIN D  25 Hydroxy (Vit-D Deficiency, Fractures)   Morbid obesity (HCC)   Has lost 16 pounds in the last six months, attributed to dietary changes and reduced snacking. She is focusing on eating non-processed, healthy foods and not worrying about calorie counting.      Relevant Medications   buPROPion  (WELLBUTRIN  XL) 150 MG 24 hr tablet   Other Visit Diagnoses       Encounter for annual wellness visit (AWV) in Medicare patient    -  Primary     Encounter for annual physical exam         Colon cancer screening       Relevant Orders   Cologuard           Adult Wellness Visit Routine wellness visit conducted. She has lost 16 pounds in the last six months, attributed to dietary changes and reduced snacking. Blood pressure is well-controlled. Mammogram is up to date as of February. Lung cancer screening was last done in November and is scheduled for this year. Cologuard screening is due as it has been three years since the last test. - Order Cologuard test - Perform routine lab tests including vitamin D , cholesterol, A1c, kidney and liver function - Continue annual mammogram - Ensure  lung cancer screening is scheduled annually  General Health Maintenance Considering getting a COVID booster and flu shot before going on a cruise in mid-September. Other vaccinations are up to date. - Advise getting COVID booster and flu shot in early September, possibly at [facility]  Follow-up Routine follow-up scheduled in six months. - Schedule follow-up appointment in six months       Return in about 6 months (around 08/05/2024) for chronic disease f/u.     Jon Eva, MD  [facility] [phone] (phone) [phone] (fax)  [facility]

## 2024-02-04 LAB — COMPREHENSIVE METABOLIC PANEL WITH GFR
ALT: 34 IU/L — ABNORMAL HIGH (ref 0–32)
AST: 29 IU/L (ref 0–40)
Albumin: 4.5 g/dL (ref 3.9–4.9)
Alkaline Phosphatase: 45 IU/L (ref 44–121)
BUN/Creatinine Ratio: 15 (ref 12–28)
BUN: 14 mg/dL (ref 8–27)
Bilirubin Total: 0.4 mg/dL (ref 0.0–1.2)
CO2: 19 mmol/L — ABNORMAL LOW (ref 20–29)
Calcium: 9.7 mg/dL (ref 8.7–10.3)
Chloride: 102 mmol/L (ref 96–106)
Creatinine, Ser: 0.94 mg/dL (ref 0.57–1.00)
Globulin, Total: 2.4 g/dL (ref 1.5–4.5)
Glucose: 91 mg/dL (ref 70–99)
Potassium: 4.8 mmol/L (ref 3.5–5.2)
Sodium: 140 mmol/L (ref 134–144)
Total Protein: 6.9 g/dL (ref 6.0–8.5)
eGFR: 67 mL/min/1.73 (ref >59)

## 2024-02-04 LAB — LIPID PANEL
Chol/HDL Ratio: 3.6 ratio (ref 0.0–4.4)
Cholesterol, Total: 175 mg/dL (ref 100–199)
HDL: 49 mg/dL (ref >39)
LDL Chol Calc (NIH): 99 mg/dL (ref 0–99)
Triglycerides: 155 mg/dL — ABNORMAL HIGH (ref 0–149)
VLDL Cholesterol Cal: 27 mg/dL (ref 5–40)

## 2024-02-04 LAB — VITAMIN D 25 HYDROXY (VIT D DEFICIENCY, FRACTURES): Vit D, 25-Hydroxy: 30.1 ng/mL (ref 30.0–100.0)

## 2024-02-04 LAB — HEMOGLOBIN A1C
Est. average glucose Bld gHb Est-mCnc: 120 mg/dL
Hgb A1c MFr Bld: 5.8 % — ABNORMAL HIGH (ref 4.8–5.6)

## 2024-02-07 ENCOUNTER — Ambulatory Visit: Payer: Self-pay | Admitting: Family Medicine

## 2024-02-21 LAB — COLOGUARD: COLOGUARD: NEGATIVE

## 2024-02-22 DIAGNOSIS — M5412 Radiculopathy, cervical region: Secondary | ICD-10-CM | POA: Diagnosis not present

## 2024-02-22 DIAGNOSIS — M62830 Muscle spasm of back: Secondary | ICD-10-CM | POA: Diagnosis not present

## 2024-02-22 DIAGNOSIS — M9901 Segmental and somatic dysfunction of cervical region: Secondary | ICD-10-CM | POA: Diagnosis not present

## 2024-02-22 DIAGNOSIS — M9902 Segmental and somatic dysfunction of thoracic region: Secondary | ICD-10-CM | POA: Diagnosis not present

## 2024-02-25 ENCOUNTER — Encounter: Payer: Self-pay | Admitting: Family Medicine

## 2024-02-25 NOTE — Telephone Encounter (Signed)
 Chart has been updated. Thank you for letting us  know.

## 2024-03-29 DIAGNOSIS — M9902 Segmental and somatic dysfunction of thoracic region: Secondary | ICD-10-CM | POA: Diagnosis not present

## 2024-03-29 DIAGNOSIS — M9901 Segmental and somatic dysfunction of cervical region: Secondary | ICD-10-CM | POA: Diagnosis not present

## 2024-03-29 DIAGNOSIS — M62830 Muscle spasm of back: Secondary | ICD-10-CM | POA: Diagnosis not present

## 2024-03-29 DIAGNOSIS — M5412 Radiculopathy, cervical region: Secondary | ICD-10-CM | POA: Diagnosis not present

## 2024-04-01 ENCOUNTER — Other Ambulatory Visit: Payer: Self-pay | Admitting: Family Medicine

## 2024-04-01 DIAGNOSIS — Z9109 Other allergy status, other than to drugs and biological substances: Secondary | ICD-10-CM

## 2024-04-03 NOTE — Telephone Encounter (Signed)
 Requested Prescriptions  Pending Prescriptions Disp Refills   fluticasone  (FLONASE ) 50 MCG/ACT nasal spray [Pharmacy Med Name: FLUTICASONE  PROP 50 MCG SPRAY] 48 mL 0    Sig: SPRAY 2 SPRAYS INTO EACH NOSTRIL EVERY DAY     Ear, Nose, and Throat: Nasal Preparations - Corticosteroids Passed - 04/03/2024  4:28 PM      Passed - Valid encounter within last 12 months    Recent Outpatient Visits           2 months ago Encounter for annual wellness visit (AWV) in Medicare patient   [place] [facility] [place], Jon HERO, MD   5 months ago Morbid obesity [facility])   [place] [facility] [place], Jon HERO, MD   8 months ago Essential hypertension   [place] [facility] [place], Jon HERO, MD               busPIRone  (BUSPAR ) 5 MG tablet [Pharmacy Med Name: BUSPIRONE  HCL 5 MG TABLET] 180 tablet 0    Sig: TAKE 1 TABLET BY MOUTH TWICE A DAY     Psychiatry: Anxiolytics/Hypnotics - Non-controlled Passed - 04/03/2024  4:28 PM      Passed - Valid encounter within last 12 months    Recent Outpatient Visits           2 months ago Encounter for annual wellness visit (AWV) in Medicare patient   [place] [facility] [place], Jon HERO, MD   5 months ago Morbid obesity [facility])   [place] [facility] [place], Jon HERO, MD   8 months ago Essential hypertension   [place] [facility] [place], Jon HERO, MD               lisinopril -hydrochlorothiazide  (ZESTORETIC ) 20-25 MG tablet [Pharmacy Med Name: LISINOPRIL -HCTZ 20-25 MG TAB] 90 tablet 0    Sig: TAKE 1 TABLET BY MOUTH EVERY DAY     Cardiovascular:  ACEI + Diuretic Combos Passed - 04/03/2024  4:28 PM      Passed - Na in normal range and within 180 days    Sodium  Date Value Ref Range Status  02/03/2024 140 134 - 144 mmol/L Final         Passed - K in normal range and within 180 days    Potassium  Date Value Ref Range Status   02/03/2024 4.8 3.5 - 5.2 mmol/L Final         Passed - Cr in normal range and within 180 days    Creat  Date Value Ref Range Status  05/10/2017 0.60 0.50 - 0.99 mg/dL Final    Comment:    For patients >96 years of age, the reference limit for Creatinine is approximately 13% higher for people identified as African-American. .    Creatinine, Ser  Date Value Ref Range Status  02/03/2024 0.94 0.57 - 1.00 mg/dL Final         Passed - eGFR is 30 or above and within 180 days    GFR, Est African American  Date Value Ref Range Status  05/10/2017 115 > OR = 60 mL/min/1.64m2 Final   GFR calc Af Amer  Date Value Ref Range Status  06/10/2020 82 >59 mL/min/1.73 Final    Comment:    **In accordance with recommendations from the NKF-ASN Task force,**   [facility] is in the process of updating its eGFR calculation to the   2021 CKD-EPI creatinine equation that estimates kidney function  without a race variable.    GFR, Est Non African American  Date Value Ref Range Status  05/10/2017 99 > OR = 60 mL/min/1.1m2 Final   GFR calc non Af Amer  Date Value Ref Range Status  06/10/2020 71 >59 mL/min/1.73 Final   eGFR  Date Value Ref Range Status  02/03/2024 67 >59 mL/min/1.73 Final         Passed - Patient is not pregnant      Passed - Last BP in normal range    BP Readings from Last 1 Encounters:  02/03/24 125/74         Passed - Valid encounter within last 6 months    Recent Outpatient Visits           2 months ago Encounter for annual wellness visit (AWV) in Medicare patient   [place] [facility] [place], Jon HERO, MD   5 months ago Morbid obesity [facility])   [place] [facility] [place], Jon HERO, MD   8 months ago Essential hypertension   [place] [facility] [place], Jon HERO, MD

## 2024-04-04 DIAGNOSIS — H2513 Age-related nuclear cataract, bilateral: Secondary | ICD-10-CM | POA: Diagnosis not present

## 2024-04-04 DIAGNOSIS — H43811 Vitreous degeneration, right eye: Secondary | ICD-10-CM | POA: Diagnosis not present

## 2024-04-04 DIAGNOSIS — H33312 Horseshoe tear of retina without detachment, left eye: Secondary | ICD-10-CM | POA: Diagnosis not present

## 2024-04-04 DIAGNOSIS — H35373 Puckering of macula, bilateral: Secondary | ICD-10-CM | POA: Diagnosis not present

## 2024-04-10 DIAGNOSIS — H35373 Puckering of macula, bilateral: Secondary | ICD-10-CM | POA: Diagnosis not present

## 2024-04-10 DIAGNOSIS — H43811 Vitreous degeneration, right eye: Secondary | ICD-10-CM | POA: Diagnosis not present

## 2024-04-10 DIAGNOSIS — H33312 Horseshoe tear of retina without detachment, left eye: Secondary | ICD-10-CM | POA: Diagnosis not present

## 2024-04-10 DIAGNOSIS — H2513 Age-related nuclear cataract, bilateral: Secondary | ICD-10-CM | POA: Diagnosis not present

## 2024-04-26 DIAGNOSIS — M5412 Radiculopathy, cervical region: Secondary | ICD-10-CM | POA: Diagnosis not present

## 2024-04-26 DIAGNOSIS — M9902 Segmental and somatic dysfunction of thoracic region: Secondary | ICD-10-CM | POA: Diagnosis not present

## 2024-04-26 DIAGNOSIS — M62830 Muscle spasm of back: Secondary | ICD-10-CM | POA: Diagnosis not present

## 2024-04-26 DIAGNOSIS — H2511 Age-related nuclear cataract, right eye: Secondary | ICD-10-CM | POA: Diagnosis not present

## 2024-04-26 DIAGNOSIS — H2513 Age-related nuclear cataract, bilateral: Secondary | ICD-10-CM | POA: Diagnosis not present

## 2024-04-26 DIAGNOSIS — M9901 Segmental and somatic dysfunction of cervical region: Secondary | ICD-10-CM | POA: Diagnosis not present

## 2024-05-08 ENCOUNTER — Ambulatory Visit
Admission: RE | Admit: 2024-05-08 | Discharge: 2024-05-08 | Disposition: A | Discharge status: Home / Self Care | Source: Ambulatory Visit | Attending: Acute Care | Admitting: Acute Care

## 2024-05-08 ENCOUNTER — Encounter: Payer: Self-pay | Admitting: Ophthalmology

## 2024-05-08 DIAGNOSIS — Z122 Encounter for screening for malignant neoplasm of respiratory organs: Secondary | ICD-10-CM | POA: Insufficient documentation

## 2024-05-08 DIAGNOSIS — Z87891 Personal history of nicotine dependence: Secondary | ICD-10-CM | POA: Insufficient documentation

## 2024-05-08 NOTE — Anesthesia Preprocedure Evaluation (Signed)
 Anesthesia Evaluation  Patient identified by MRN, date of birth, ID band Patient awake    Reviewed: Allergy & Precautions, H&P , NPO status , Patient's Chart, lab work & pertinent test results  Airway Mallampati: III  TM Distance: <3 FB Neck ROM: Full    Dental no notable dental hx. (+) Caps Cap right upper central incisor :   Pulmonary neg pulmonary ROS, COPD, former smoker   Pulmonary exam normal breath sounds clear to auscultation       Cardiovascular hypertension, negative cardio ROS Normal cardiovascular exam Rhythm:Regular Rate:Normal     Neuro/Psych   Anxiety     negative neurological ROS  negative psych ROS   GI/Hepatic negative GI ROS, Neg liver ROS,,,  Endo/Other  negative endocrine ROS    Renal/GU negative Renal ROS  negative genitourinary   Musculoskeletal negative musculoskeletal ROS (+)    Abdominal   Peds negative pediatric ROS (+)  Hematology negative hematology ROS (+)   Anesthesia Other Findings Hx vitrectomy 11-03-23 Dr. 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  Allergy  Anxiety Hyperlipidemia  Hypertension Pre-diabetes  Obesity (BMI 30-39.9)    Reproductive/Obstetrics negative OB ROS                              Anesthesia Physical Anesthesia Plan  ASA: 2  Anesthesia Plan: MAC   Post-op Pain Management:    Induction: Intravenous  PONV Risk Score and Plan:   Airway Management Planned: Natural Airway and Nasal Cannula  Additional Equipment:   Intra-op Plan:   Post-operative Plan:   Informed Consent: I have reviewed the patients History and Physical, chart, labs and discussed the procedure including the risks, benefits and alternatives for the proposed anesthesia with the patient or authorized representative who has indicated his/her understanding and acceptance.     Dental Advisory Given  Plan Discussed with: Anesthesiologist, CRNA and  Surgeon  Anesthesia Plan Comments: (Patient consented for risks of anesthesia including but not limited to:  - adverse reactions to medications - damage to eyes, teeth, lips or other oral mucosa - nerve damage due to positioning  - sore throat or hoarseness - Damage to heart, brain, nerves, lungs, other parts of body or loss of life  Patient voiced understanding and assent.)        Anesthesia Quick Evaluation

## 2024-05-08 NOTE — Discharge Instructions (Signed)

## 2024-05-10 ENCOUNTER — Other Ambulatory Visit: Payer: Self-pay

## 2024-05-10 ENCOUNTER — Ambulatory Visit: Admission: RE | Admit: 2024-05-10 | Discharge: 2024-05-10 | Disposition: A | Discharge status: Home / Self Care

## 2024-05-10 ENCOUNTER — Ambulatory Visit: Payer: Self-pay | Admitting: Anesthesiology

## 2024-05-10 ENCOUNTER — Encounter: Admission: RE | Disposition: A | Payer: Self-pay | Source: Home / Self Care

## 2024-05-10 DIAGNOSIS — F419 Anxiety disorder, unspecified: Secondary | ICD-10-CM | POA: Diagnosis not present

## 2024-05-10 DIAGNOSIS — Z6834 Body mass index (BMI) 34.0-34.9, adult: Secondary | ICD-10-CM | POA: Insufficient documentation

## 2024-05-10 DIAGNOSIS — H2511 Age-related nuclear cataract, right eye: Secondary | ICD-10-CM | POA: Diagnosis not present

## 2024-05-10 DIAGNOSIS — H25011 Cortical age-related cataract, right eye: Secondary | ICD-10-CM | POA: Diagnosis not present

## 2024-05-10 DIAGNOSIS — E669 Obesity, unspecified: Secondary | ICD-10-CM | POA: Insufficient documentation

## 2024-05-10 DIAGNOSIS — I10 Essential (primary) hypertension: Secondary | ICD-10-CM | POA: Diagnosis not present

## 2024-05-10 DIAGNOSIS — E785 Hyperlipidemia, unspecified: Secondary | ICD-10-CM | POA: Insufficient documentation

## 2024-05-10 DIAGNOSIS — Z79899 Other long term (current) drug therapy: Secondary | ICD-10-CM | POA: Insufficient documentation

## 2024-05-10 DIAGNOSIS — Z87891 Personal history of nicotine dependence: Secondary | ICD-10-CM | POA: Insufficient documentation

## 2024-05-10 DIAGNOSIS — J4489 Other specified chronic obstructive pulmonary disease: Secondary | ICD-10-CM | POA: Insufficient documentation

## 2024-05-10 HISTORY — DX: Obesity, unspecified: E66.9

## 2024-05-10 HISTORY — PX: CATARACT EXTRACTION W/PHACO: SHX586

## 2024-05-10 HISTORY — DX: Prediabetes: R73.03

## 2024-05-10 SURGERY — PHACOEMULSIFICATION, CATARACT, WITH IOL INSERTION
Anesthesia: Monitor Anesthesia Care | Site: Eye | Laterality: Right

## 2024-05-10 MED ORDER — MIDAZOLAM HCL 2 MG/2ML IJ SOLN
INTRAMUSCULAR | Status: AC
  Filled 2024-05-10: qty 2

## 2024-05-10 MED ORDER — CYCLOPENTOLATE HCL 2 % OP SOLN
1.0000 [drp] | OPHTHALMIC | Status: AC
Start: 2024-05-10 — End: 2024-05-10
  Administered 2024-05-10 (×3): 1 [drp] via OPHTHALMIC

## 2024-05-10 MED ORDER — SEVOFLURANE IN SOLN
RESPIRATORY_TRACT | Status: AC
  Filled 2024-05-10: qty 250

## 2024-05-10 MED ORDER — FENTANYL CITRATE (PF) 100 MCG/2ML IJ SOLN
INTRAMUSCULAR | Status: DC | PRN
  Administered 2024-05-10 (×2): 50 ug via INTRAVENOUS

## 2024-05-10 MED ORDER — MIDAZOLAM HCL (PF) 2 MG/2ML IJ SOLN
INTRAMUSCULAR | Status: DC | PRN
  Administered 2024-05-10 (×3): 1 mg via INTRAVENOUS

## 2024-05-10 MED ORDER — SIGHTPATH DOSE#1 BSS IO SOLN
INTRAOCULAR | Status: DC | PRN
  Administered 2024-05-10: 15 mL via INTRAOCULAR

## 2024-05-10 MED ORDER — LACTATED RINGERS IV SOLN: INTRAVENOUS | Status: DC

## 2024-05-10 MED ORDER — ACETYLCHOLINE CHLORIDE 20 MG IO SOLR
INTRAOCULAR | Status: AC
  Filled 2024-05-10: qty 2

## 2024-05-10 MED ORDER — FENTANYL CITRATE (PF) 100 MCG/2ML IJ SOLN
INTRAMUSCULAR | Status: AC
  Filled 2024-05-10: qty 2

## 2024-05-10 MED ORDER — PHENYLEPHRINE HCL 10 % OP SOLN
1.0000 [drp] | OPHTHALMIC | Status: AC
  Administered 2024-05-10 (×3): 1 [drp] via OPHTHALMIC

## 2024-05-10 MED ORDER — BALANCED SALT IO SOLN
INTRAOCULAR | Status: DC | PRN
  Administered 2024-05-10: 4 mL via INTRAOCULAR

## 2024-05-10 MED ORDER — CYCLOPENTOLATE HCL 2 % OP SOLN
OPHTHALMIC | Status: AC
Start: 2024-05-10 — End: 2024-05-10
  Filled 2024-05-10: qty 2

## 2024-05-10 MED ORDER — TETRACAINE HCL 0.5 % OP SOLN
OPHTHALMIC | Status: AC
  Filled 2024-05-10: qty 4

## 2024-05-10 MED ORDER — MOXIFLOXACIN HCL 0.5 % OP SOLN
OPHTHALMIC | Status: DC | PRN
  Administered 2024-05-10: .2 mL via OPHTHALMIC

## 2024-05-10 MED ORDER — ACETAMINOPHEN 325 MG PO TABS
ORAL_TABLET | ORAL | Status: AC
  Filled 2024-05-10: qty 2

## 2024-05-10 MED ORDER — TETRACAINE HCL 0.5 % OP SOLN
1.0000 [drp] | OPHTHALMIC | Status: DC | PRN
  Administered 2024-05-10 (×3): 1 [drp] via OPHTHALMIC

## 2024-05-10 MED ORDER — BRIMONIDINE TARTRATE-TIMOLOL 0.2-0.5 % OP SOLN
OPHTHALMIC | Status: DC | PRN
  Administered 2024-05-10: 1 [drp] via OPHTHALMIC

## 2024-05-10 MED ORDER — ACETAMINOPHEN 650 MG RE SUPP
650.0000 mg | Freq: Once | RECTAL | Status: AC
  Administered 2024-05-10: 650 mg via RECTAL

## 2024-05-10 MED ORDER — SIGHTPATH DOSE#1 NA CHONDROIT SULF-NA HYALURON 20-15 MG/0.5ML IO SOSY
INTRAOCULAR | Status: DC | PRN
  Administered 2024-05-10: .5 mL via INTRAOCULAR

## 2024-05-10 MED ORDER — CARBACHOL 0.01 % IO SOLN
INTRAOCULAR | Status: DC | PRN
  Administered 2024-05-10: .5 mL via INTRAOCULAR

## 2024-05-10 MED ORDER — SIGHTPATH DOSE#1 BSS IO SOLN
INTRAOCULAR | Status: DC | PRN
  Administered 2024-05-10: 116 mL via OPHTHALMIC

## 2024-05-10 MED ORDER — SIGHTPATH DOSE#1 NA HYALUR & NA CHOND-NA HYALUR IO KIT
PACK | INTRAOCULAR | Status: DC | PRN
  Administered 2024-05-10: 1 via OPHTHALMIC

## 2024-05-10 MED ORDER — PHENYLEPHRINE HCL 10 % OP SOLN
OPHTHALMIC | Status: AC
  Filled 2024-05-10: qty 5

## 2024-05-10 SURGICAL SUPPLY — 11 items
CANNULA HYDRODISSEC NUC 25X7/8 (MISCELLANEOUS) ×1 IMPLANT
DRSG TEGADERM 2-3/8X2-3/4 SM (GAUZE/BANDAGES/DRESSINGS) ×1 IMPLANT
FEE CATARACT SUITE SIGHTPATH (MISCELLANEOUS) ×1 IMPLANT
GLOVE BIOGEL PI IND STRL 8 (GLOVE) ×1 IMPLANT
GLOVE SURG LX STRL 7.5 STRW (GLOVE) ×1 IMPLANT
GLOVE SURG SYN 6.5 PF PI BL (GLOVE) ×1 IMPLANT
LENS IOL ACRSF MP 11.5 (Intraocular Lens) IMPLANT
LENS IOL CLRN CLEAR 12.0 (Intraocular Lens) IMPLANT
NDL FILTER BLUNT 18X1 1/2 (NEEDLE) ×1 IMPLANT
NEEDLE FILTER BLUNT 18X1 1/2 (NEEDLE) ×1 IMPLANT
SYR 3ML LL SCALE MARK (SYRINGE) ×1 IMPLANT

## 2024-05-10 NOTE — H&P (Signed)
 [facility]   Primary Care Physician:  Myrla Jon HERO, MD Ophthalmologist: Dr. Curtistine Fava  Pre-Procedure History & Physical: HPI:  Bonnie Duffy is a 68 y.o. female here for cataract surgery.   Past Medical History:  Diagnosis Date   Allergy    Anxiety    Hyperlipidemia    Hypertension    Obesity (BMI 30-39.9)    Pre-diabetes     Past Surgical History:  Procedure Laterality Date   EYE SURGERY  2025   TONSILLECTOMY  1960   TUBAL LIGATION      Prior to Admission medications   Medication Sig Start Date End Date Taking? Authorizing Provider  ALPRAZolam  (XANAX ) 0.25 MG tablet Take 0.5 tablets (0.125 mg total) by mouth 2 (two) times daily as needed for anxiety. 07/31/22  Yes Bacigalupo, Jon HERO, MD  buPROPion  (WELLBUTRIN  XL) 150 MG 24 hr tablet Take 1 tablet (150 mg total) by mouth daily. 02/03/24  Yes Bacigalupo, Jon HERO, MD  busPIRone  (BUSPAR ) 5 MG tablet TAKE 1 TABLET BY MOUTH TWICE A DAY 04/03/24  Yes Bacigalupo, Jon HERO, MD  fenofibrate  (TRICOR ) 145 MG tablet TAKE 1 TABLET (145 MG TOTAL) DAILY BY MOUTH. 01/06/24  Yes Bacigalupo, Jon HERO, MD  fesoterodine  (TOVIAZ ) 8 MG TB24 tablet TAKE 1 TABLET (8 MG TOTAL) DAILY BY MOUTH. 01/06/24  Yes Bacigalupo, Angela M, MD  fexofenadine (ALLEGRA) 180 MG tablet Take by mouth.   Yes [provider]  fluticasone  (FLONASE ) 50 MCG/ACT nasal spray SPRAY 2 SPRAYS INTO EACH NOSTRIL EVERY DAY 04/03/24  Yes Bacigalupo, Jon HERO, MD  lisinopril -hydrochlorothiazide  (ZESTORETIC ) 20-25 MG tablet TAKE 1 TABLET BY MOUTH EVERY DAY 04/03/24  Yes Bacigalupo, Angela M, MD  magnesium 30 MG tablet Take by mouth 1 day or 1 dose.   Yes [provider]  metoprolol  succinate (TOPROL -XL) 50 MG 24 hr tablet TAKE 0.5 TABLETS (25 MG TOTAL) BY MOUTH DAILY. TAKE WITH OR IMMEDIATELY FOLLOWING A MEAL. 01/06/24  Yes Bacigalupo, Angela M, MD  Multiple Vitamins-Minerals (MULTIVITAMIN PO) Take by mouth.   Yes [provider]   naltrexone  (DEPADE) 50 MG tablet Take 0.5 tablets (25 mg total) by mouth daily. 11/02/23  Yes Bacigalupo, Angela M, MD  Omega-3 Fatty Acids (FISH OIL) 1200 MG CAPS Take by mouth.   Yes [provider]  rosuvastatin  (CRESTOR ) 10 MG tablet TAKE 1 TABLET BY MOUTH EVERY DAY 01/06/24  Yes Bacigalupo, Angela M, MD  Turmeric (QC TUMERIC COMPLEX PO) Take by mouth daily.   Yes [provider]  vitamin C (ASCORBIC ACID) 500 MG tablet Take 500 mg daily by mouth.   Yes [provider]  Vitamin D , Ergocalciferol , 2000 units CAPS Take by mouth.   Yes [provider]    Allergies as of 04/13/2024 - Review Complete 02/03/2024  Allergen Reaction Noted   Penicillins  04/16/2011   Sulfamethoxazole Rash 12/12/2010    Family History  Problem Relation Age of Onset   Heart disease Mother    Lung cancer Father    Prostate cancer Father    Heart disease Father        chemo-induced   Heart attack Father    Skin cancer Father    Cancer Father    Stroke Sister 76   Miscarriages / Stillbirths Sister    Healthy Brother    Heart disease Maternal Grandmother    Diabetes Maternal Grandmother    Breast cancer Maternal Grandmother 65   Heart disease Maternal Grandfather  Heart disease Paternal Grandmother    Leukemia Paternal Grandfather    Cancer Paternal Grandfather    ADD / ADHD Son    Learning disabilities Son    Anxiety disorder Maternal Uncle    Depression Maternal Uncle    Colon cancer Neg Hx    Ovarian cancer Neg Hx    Cervical cancer Neg Hx     Social History   Socioeconomic History   Marital status: Married    Spouse name: Not on file   Number of children: 2   Years of education: Not on file   Highest education level: Master's degree (e.g., MA, MS, MEng, MEd, MSW, MBA)  Occupational History    Employer: [employer]    Comment: retired  Tobacco Use   Smoking status: Former    Current packs/day: 0.00    Average packs/day: 1 pack/day for 46.0 years (46.0  ttl pk-yrs)    Types: Cigarettes    Start date: 03/03/1972    Quit date: 03/03/2018    Years since quitting: 6.1   Smokeless tobacco: Never   Tobacco comments:    started smoking at age 84; has quit smoking for more than 10 years altogther in the past  Vaping Use   Vaping status: Never Used  Substance and Sexual Activity   Alcohol use: Yes    Alcohol/week: 1.0 standard drink of alcohol    Types: 1 Glasses of wine per week    Comment: one drink per month; wine   Drug use: No   Sexual activity: Yes    Birth control/protection: Post-menopausal, None  Other Topics Concern   Not on file  Social History Narrative   Not on file   Social Drivers of Health   Financial Resource Strain: Low Risk  (02/01/2024)   Overall Financial Resource Strain (CARDIA)    Difficulty of Paying Living Expenses: Not hard at all  Food Insecurity: No Food Insecurity (02/01/2024)   Hunger Vital Sign    Worried About Running Out of Food in the Last Year: Never true    Ran Out of Food in the Last Year: Never true  Transportation Needs: No Transportation Needs (02/01/2024)   PRAPARE - Administrator, Civil Service (Medical): No    Lack of Transportation (Non-Medical): No  Physical Activity: Sufficiently Active (02/01/2024)   Exercise Vital Sign    Days of Exercise per Week: 3 days    Minutes of Exercise per Session: 60 min  Stress: No Stress Concern Present (02/01/2024)   [employer] of Occupational Health - Occupational Stress Questionnaire    Feeling of Stress: Only a little  Social Connections: Socially Integrated (02/01/2024)   Social Connection and Isolation Panel    Frequency of Communication with Friends and Family: More than three times a week    Frequency of Social Gatherings with Friends and Family: Twice a week    Attends Religious Services: More than 4 times per year    Active Member of [employer] or Organizations: Yes    Attends Engineer, Structural: More than 4 times per year     Marital Status: Married  Catering Manager Violence: Not At Risk (02/03/2024)   Humiliation, Afraid, Rape, and Kick questionnaire    Fear of Current or Ex-Partner: No    Emotionally Abused: No    Physically Abused: No    Sexually Abused: No    Review of Systems: See HPI, otherwise negative ROS  Physical Exam: BP (!) 144/79   Pulse 78  Temp 97.8 F (36.6 C) (Temporal)   Resp 18   Ht 5' 3 (1.6 m)   Wt 88.9 kg   SpO2 95%   BMI 34.72 kg/m  General:   Alert, cooperative in NAD Head:  Normocephalic and atraumatic. Respiratory:  Normal work of breathing. Cardiovascular:  RRR  Impression/Plan: Bonnie Duffy is here for cataract surgery.  Risks, benefits, limitations, and alternatives regarding cataract surgery have been reviewed with the patient.  Questions have been answered.  All parties agreeable.   Curtistine JINNY Fava, MD  05/10/2024, 1:48 PM

## 2024-05-10 NOTE — Transfer of Care (Signed)
 Immediate Anesthesia Transfer of Care Note  Patient: Bonnie Duffy  Procedure(s) Performed: PHACOEMULSIFICATION, CATARACT, WITH IOL INSERTION 23.87 02:05.0 (Right: Eye)  Patient Location: PACU  Anesthesia Type: MAC  Level of Consciousness: awake, alert  and patient cooperative  Airway and Oxygen Therapy: Patient Spontanous Breathing and Patient connected to supplemental oxygen  Post-op Assessment: Post-op Vital signs reviewed, Patient's Cardiovascular Status Stable, Respiratory Function Stable, Patent Airway and No signs of Nausea or vomiting  Post-op Vital Signs: Reviewed and stable  Complications: No notable events documented.

## 2024-05-10 NOTE — Anesthesia Postprocedure Evaluation (Signed)
 Anesthesia Post Note  Patient: Bonnie Duffy  Procedure(s) Performed: PHACOEMULSIFICATION, CATARACT, WITH IOL INSERTION 23.87 02:05.0 (Right: Eye)  Patient location during evaluation: PACU Anesthesia Type: MAC Level of consciousness: awake and alert Pain management: pain level controlled Vital Signs Assessment: post-procedure vital signs reviewed and stable Respiratory status: spontaneous breathing, nonlabored ventilation, respiratory function stable and patient connected to nasal cannula oxygen Cardiovascular status: stable and blood pressure returned to baseline Postop Assessment: no apparent nausea or vomiting Anesthetic complications: no   No notable events documented.   Last Vitals:  Vitals:   05/10/24 1442 05/10/24 1446  BP: 136/77 108/77  Pulse: 74 75  Resp: 14 13  Temp: 36.4 C 36.4 C  SpO2: 95% 96%    Last Pain:  Vitals:   05/10/24 1458  TempSrc:   PainSc: 5                  [name]

## 2024-05-10 NOTE — Op Note (Signed)
 PREOPERATIVE DIAGNOSIS:  Nuclear sclerotic cataract of the right eye.   POSTOPERATIVE DIAGNOSIS:  Right Eye Cataract   OPERATIVE PROCEDURE:ORPROCALL@   SURGEON:  Curtistine Fava, MD   ANESTHESIA:  Anesthesiologist: Ola Donny BROCKS, MD CRNA: Jahoo, Sonia, CRNA  Monitored anesthesia care. Topical tetracaine drops followed by 2% Xylocaine  jelly applied in the preoperative holding area 0.76ml of epi-Shugarcaine was instilled in the eye following the paracentesis.   COMPLICATIONS:  None.   TECHNIQUE:   Phacoemulsification divide and conquer   DESCRIPTION OF PROCEDURE:  The patient was examined and consented in the preoperative holding area where the aforementioned topical anesthesia was applied to the right eye and then brought back to the Operating Room where the right eye was prepped and draped in the usual sterile ophthalmic fashion and a lid speculum was placed. A paracentesis was created with the side port blade and the anterior chamber was filled with epi-Shugarcaine follower by viscoelastic. A clear corneal incision was performed with the steel keratome. A continuous curvilinear capsulorrhexis was performed with a cystotome followed by the capsulorrhexis forceps. Hydrodissection and hydrodelineation were carried out with BSS on a blunt cannula. The lens was removed in a divide and conquer technique and the remaining cortical material was removed with the irrigation-aspiration handpiece. At this point an anterior rent was noted in the anterior capsulorhexis, that had not radialized. The sulcus was inflated with viscoelastic and the MA60AC +11.5 lens was placed in the ciliary sulcus without complication. The remaining viscoelastic was removed from the eye with the irrigation-aspiration handpiece. The wounds were hydrated. The anterior chamber was flushed with BSS and the eye was inflated to physiologic pressure. 0.1ml of Vigamox was placed in the anterior chamber. MioStat was injected into the  anterior chamber. The wounds were found to be water tight. The eye was dressed with Combigan and covered with a clear shield to be worn until the first postoperative day appointment. The patient was given protective glasses to wear throughout the day. The patient was also given drops with which to begin a drop regimen today and will follow-up with me in one day. Implant Name Type Inv. Item Serial No. Manufacturer Lot No. LRB No. Used Action  LENS IOL CLRN CLEAR 12.0 - [id number] Intraocular Lens LENS IOL CLRN CLEAR 12.0 [id number] [facility]  Right 1 Wasted  LENS IOL ACRSF MP 11.5 - [id number] Intraocular Lens LENS IOL ACRSF MP 11.5 [id number] [employer]  Right 1 Implanted   Procedure(s): PHACOEMULSIFICATION, CATARACT, WITH IOL INSERTION 23.87 02:05.0 (Right)  Electronically signed: Curtistine PARAS [facility] 05/10/2024 2:42 PM

## 2024-05-11 DIAGNOSIS — H2511 Age-related nuclear cataract, right eye: Secondary | ICD-10-CM | POA: Diagnosis not present

## 2024-05-12 ENCOUNTER — Other Ambulatory Visit: Payer: Self-pay | Admitting: Acute Care

## 2024-05-12 DIAGNOSIS — Z87891 Personal history of nicotine dependence: Secondary | ICD-10-CM

## 2024-05-12 DIAGNOSIS — Z122 Encounter for screening for malignant neoplasm of respiratory organs: Secondary | ICD-10-CM

## 2024-05-15 ENCOUNTER — Encounter: Payer: Self-pay | Admitting: Ophthalmology

## 2024-05-16 NOTE — Discharge Instructions (Signed)

## 2024-05-16 NOTE — Anesthesia Preprocedure Evaluation (Signed)
 Anesthesia Evaluation  Patient identified by MRN, date of birth, ID band Patient awake    Reviewed: Allergy & Precautions, H&P , NPO status , Patient's Chart, lab work & pertinent test results  Airway Mallampati: II  TM Distance: <3 FB Neck ROM: Full    Dental no notable dental hx. (+) Caps Cap right upper central incisor:   Pulmonary COPD, former smoker   Pulmonary exam normal breath sounds clear to auscultation       Cardiovascular hypertension, Normal cardiovascular exam Rhythm:Regular Rate:Normal     Neuro/Psych   Anxiety     negative neurological ROS  negative psych ROS   GI/Hepatic negative GI ROS, Neg liver ROS,,,  Endo/Other  negative endocrine ROS    Renal/GU negative Renal ROS  negative genitourinary   Musculoskeletal negative musculoskeletal ROS (+)    Abdominal   Peds negative pediatric ROS (+)  Hematology negative hematology ROS (+)   Anesthesia Other Findings Previous cataract 05-10-24 Dr. Ola, patient states she had recall and had pain in the middle of the surgery. States did not have pain nor recall in the beginning nor at the end, but is concerned about this time. Discussed w/surgeon and will consider more tetracaine intra-op, plus discussed w/CRNA and will consider more sedation intra-op.  Previous cataract surgery had versed 3 mg IV and fentanyl 100 mcg IV  Hx vitrectomy 11-03-23 Dr. 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   Allergy             Anxiety Hyperlipidemia             Hypertension Pre-diabetes             Obesity (BMI 30-39.9)   Reproductive/Obstetrics negative OB ROS                              Anesthesia Physical Anesthesia Plan  ASA: 2  Anesthesia Plan: MAC   Post-op Pain Management:    Induction: Intravenous  PONV Risk Score and Plan:   Airway Management Planned: Natural Airway and Nasal Cannula  Additional Equipment:   Intra-op  Plan:   Post-operative Plan:   Informed Consent: I have reviewed the patients History and Physical, chart, labs and discussed the procedure including the risks, benefits and alternatives for the proposed anesthesia with the patient or authorized representative who has indicated his/her understanding and acceptance.     Dental Advisory Given  Plan Discussed with: Anesthesiologist, CRNA and Surgeon  Anesthesia Plan Comments: (Patient consented for risks of anesthesia including but not limited to:  - adverse reactions to medications - damage to eyes, teeth, lips or other oral mucosa - nerve damage due to positioning  - sore throat or hoarseness - Damage to heart, brain, nerves, lungs, other parts of body or loss of life  Patient voiced understanding and assent.)         Anesthesia Quick Evaluation

## 2024-05-17 ENCOUNTER — Ambulatory Visit: Admission: RE | Admit: 2024-05-17 | Discharge: 2024-05-17 | Disposition: A | Discharge status: Home / Self Care

## 2024-05-17 ENCOUNTER — Ambulatory Visit: Payer: Self-pay | Admitting: Anesthesiology

## 2024-05-17 ENCOUNTER — Other Ambulatory Visit: Payer: Self-pay

## 2024-05-17 ENCOUNTER — Encounter: Admission: RE | Disposition: A | Discharge status: Home / Self Care | Payer: Self-pay | Source: Home / Self Care

## 2024-05-17 DIAGNOSIS — Z6835 Body mass index (BMI) 35.0-35.9, adult: Secondary | ICD-10-CM | POA: Insufficient documentation

## 2024-05-17 DIAGNOSIS — I10 Essential (primary) hypertension: Secondary | ICD-10-CM | POA: Insufficient documentation

## 2024-05-17 DIAGNOSIS — F419 Anxiety disorder, unspecified: Secondary | ICD-10-CM | POA: Insufficient documentation

## 2024-05-17 DIAGNOSIS — Z87891 Personal history of nicotine dependence: Secondary | ICD-10-CM | POA: Diagnosis not present

## 2024-05-17 DIAGNOSIS — Z833 Family history of diabetes mellitus: Secondary | ICD-10-CM | POA: Diagnosis not present

## 2024-05-17 DIAGNOSIS — H2512 Age-related nuclear cataract, left eye: Secondary | ICD-10-CM | POA: Insufficient documentation

## 2024-05-17 DIAGNOSIS — J449 Chronic obstructive pulmonary disease, unspecified: Secondary | ICD-10-CM | POA: Insufficient documentation

## 2024-05-17 DIAGNOSIS — E669 Obesity, unspecified: Secondary | ICD-10-CM | POA: Insufficient documentation

## 2024-05-17 DIAGNOSIS — E785 Hyperlipidemia, unspecified: Secondary | ICD-10-CM | POA: Diagnosis not present

## 2024-05-17 HISTORY — PX: CATARACT EXTRACTION W/PHACO: SHX586

## 2024-05-17 SURGERY — PHACOEMULSIFICATION, CATARACT, WITH IOL INSERTION
Anesthesia: Monitor Anesthesia Care | Laterality: Left

## 2024-05-17 MED ORDER — SIGHTPATH DOSE#1 BSS IO SOLN
INTRAOCULAR | Status: DC | PRN
  Administered 2024-05-17: 15 mL via INTRAOCULAR

## 2024-05-17 MED ORDER — SIGHTPATH DOSE#1 NA HYALUR & NA CHOND-NA HYALUR IO KIT
PACK | INTRAOCULAR | Status: DC | PRN
Start: 2024-05-17 — End: 2024-05-17
  Administered 2024-05-17: 1 via OPHTHALMIC

## 2024-05-17 MED ORDER — FENTANYL CITRATE (PF) 100 MCG/2ML IJ SOLN
INTRAMUSCULAR | Status: AC
  Filled 2024-05-17: qty 2

## 2024-05-17 MED ORDER — SIGHTPATH DOSE#1 BSS IO SOLN
INTRAOCULAR | Status: DC | PRN
  Administered 2024-05-17: 80 mL via OPHTHALMIC

## 2024-05-17 MED ORDER — CYCLOPENTOLATE HCL 2 % OP SOLN
1.0000 [drp] | OPHTHALMIC | Status: AC
  Administered 2024-05-17 (×3): 1 [drp] via OPHTHALMIC

## 2024-05-17 MED ORDER — TETRACAINE HCL 0.5 % OP SOLN
OPHTHALMIC | Status: AC
  Filled 2024-05-17: qty 4

## 2024-05-17 MED ORDER — MIDAZOLAM HCL 2 MG/2ML IJ SOLN
INTRAMUSCULAR | Status: AC
  Filled 2024-05-17: qty 2

## 2024-05-17 MED ORDER — LACTATED RINGERS IV SOLN: INTRAVENOUS | Status: DC

## 2024-05-17 MED ORDER — CYCLOPENTOLATE HCL 2 % OP SOLN
OPHTHALMIC | Status: AC
  Filled 2024-05-17: qty 2

## 2024-05-17 MED ORDER — PHENYLEPHRINE HCL 10 % OP SOLN
1.0000 [drp] | OPHTHALMIC | Status: AC
Start: 2024-05-17 — End: 2024-05-17
  Administered 2024-05-17 (×3): 1 [drp] via OPHTHALMIC

## 2024-05-17 MED ORDER — BRIMONIDINE TARTRATE-TIMOLOL 0.2-0.5 % OP SOLN
OPHTHALMIC | Status: DC | PRN
Start: 2024-05-17 — End: 2024-05-17
  Administered 2024-05-17: 1 [drp] via OPHTHALMIC

## 2024-05-17 MED ORDER — MIDAZOLAM HCL (PF) 2 MG/2ML IJ SOLN
INTRAMUSCULAR | Status: DC | PRN
Start: 2024-05-17 — End: 2024-05-17
  Administered 2024-05-17 (×4): 1 mg via INTRAVENOUS

## 2024-05-17 MED ORDER — LIDOCAINE HCL (PF) 2 % IJ SOLN
INTRAOCULAR | Status: DC | PRN
  Administered 2024-05-17: 1 mL via INTRAOCULAR

## 2024-05-17 MED ORDER — FENTANYL CITRATE (PF) 100 MCG/2ML IJ SOLN
INTRAMUSCULAR | Status: DC | PRN
  Administered 2024-05-17 (×3): 50 ug via INTRAVENOUS

## 2024-05-17 MED ORDER — MOXIFLOXACIN HCL 0.5 % OP SOLN
OPHTHALMIC | Status: DC | PRN
  Administered 2024-05-17: .2 mL via OPHTHALMIC

## 2024-05-17 MED ORDER — SIGHTPATH DOSE#1 NA CHONDROIT SULF-NA HYALURON 20-15 MG/0.5ML IO SOSY
INTRAOCULAR | Status: DC | PRN
  Administered 2024-05-17: .5 mL via INTRAOCULAR

## 2024-05-17 MED ORDER — TETRACAINE HCL 0.5 % OP SOLN
1.0000 [drp] | OPHTHALMIC | Status: AC
  Administered 2024-05-17 (×3): 1 [drp] via OPHTHALMIC

## 2024-05-17 MED ORDER — PHENYLEPHRINE HCL 10 % OP SOLN
OPHTHALMIC | Status: AC
  Filled 2024-05-17: qty 5

## 2024-05-17 SURGICAL SUPPLY — 10 items
CANNULA HYDRODISSEC NUC 25X7/8 (MISCELLANEOUS) ×1 IMPLANT
DRSG TEGADERM 2-3/8X2-3/4 SM (GAUZE/BANDAGES/DRESSINGS) ×1 IMPLANT
FEE CATARACT SUITE SIGHTPATH (MISCELLANEOUS) ×1 IMPLANT
GLOVE BIOGEL PI IND STRL 8 (GLOVE) ×1 IMPLANT
GLOVE SURG LX STRL 7.5 STRW (GLOVE) ×1 IMPLANT
GLOVE SURG SYN 6.5 PF PI BL (GLOVE) ×1 IMPLANT
LENS IOL CLRN CLEAR 13.0 IMPLANT
NDL FILTER BLUNT 18X1 1/2 (NEEDLE) ×1 IMPLANT
NEEDLE FILTER BLUNT 18X1 1/2 (NEEDLE) ×1 IMPLANT
SYR 3ML LL SCALE MARK (SYRINGE) ×1 IMPLANT

## 2024-05-17 NOTE — Anesthesia Postprocedure Evaluation (Signed)
 Anesthesia Post Note  Patient: Bonnie Duffy  Procedure(s) Performed: PHACOEMULSIFICATION, CATARACT, WITH IOL INSERTION 4.71 00:32.2 (Left)  Patient location during evaluation: PACU Anesthesia Type: MAC Level of consciousness: awake and alert Pain management: pain level controlled Vital Signs Assessment: post-procedure vital signs reviewed and stable Respiratory status: spontaneous breathing, nonlabored ventilation, respiratory function stable and patient connected to nasal cannula oxygen Cardiovascular status: stable and blood pressure returned to baseline Postop Assessment: no apparent nausea or vomiting Anesthetic complications: no   No notable events documented.   Last Vitals:  Vitals:   05/17/24 1306 05/17/24 1309  BP: 96/74 101/69  Pulse: 71 71  Resp: 17 18  Temp: (!) 36.1 C (!) 36.1 C  SpO2: 93% 93%    Last Pain:  Vitals:   05/17/24 1309  TempSrc:   PainSc: 0-No pain                 Jacobie Stamey C Pamelia Botto

## 2024-05-17 NOTE — Op Note (Signed)
 PREOPERATIVE DIAGNOSIS:  Nuclear sclerotic cataract of the left eye.   POSTOPERATIVE DIAGNOSIS:  Nuclear sclerotic cataract of the left eye.   OPERATIVE PROCEDURE: Phacoemulsification with IOL Implant Left Eye   SURGEON:  Curtistine Fava, MD   ANESTHESIA:  Anesthesiologist: Ola Donny BROCKS, MD CRNA: Jahoo, Sonia, CRNA  1.      Managed anesthesia care. 2.     0.83ml of epi-Shugarcaine was instilled following the paracentesis   COMPLICATIONS:  None.   TECHNIQUE:   Phacoemulsification divide and conquer   DESCRIPTION OF PROCEDURE:  The patient was examined and consented in the preoperative holding area where the aforementioned topical anesthesia was applied to the left eye and then brought back to the Operating Room where the left eye was prepped and draped in the usual sterile ophthalmic fashion and a lid speculum was placed. A paracentesis was created with the side port blade and the anterior chamber was filled with epi-Shugarcaine follower by viscoelastic. A clear corneal incision was performed with the steel keratome. A continuous curvilinear capsulorrhexis was performed with a cystotome followed by the capsulorrhexis forceps. Hydrodissection and hydrodelineation were carried out with BSS on a blunt cannula. The lens was removed in a divide and conquer technique and the remaining cortical material was removed with the irrigation-aspiration handpiece. The capsular bag was inflated with viscoelastic and the CC60WF +13.0 lens was placed in the capsular bag without complication. The remaining viscoelastic was removed from the eye with the irrigation-aspiration handpiece. The wounds were hydrated. The anterior chamber was flushed with BSS and the eye was inflated to physiologic pressure. 0.82ml of Vigamox  was placed in the anterior chamber. The wounds were found to be water tight. The eye was dressed with Combigan  and covered with a clear shield to be worn until the first postoperative day appointment.  The patient was given protective glasses to wear throughout the day. The patient was also given drops with which to begin a drop regimen today and will follow-up with me in one day. Implant Name Type Inv. Item Serial No. Manufacturer Lot No. LRB No. Used Action  ALCON CLAREON IOL Intraocular Lens  [id number] [employer]  Left 1 Implanted    Procedure(s): PHACOEMULSIFICATION, CATARACT, WITH IOL INSERTION 4.71 00:32.2 (Left)  Electronically signed: Curtistine PARAS [facility] 05/17/2024 1:04 PM

## 2024-05-17 NOTE — Transfer of Care (Signed)
 Immediate Anesthesia Transfer of Care Note  Patient: Bonnie Duffy  Procedure(s) Performed: PHACOEMULSIFICATION, CATARACT, WITH IOL INSERTION 4.71 00:32.2 (Left)  Patient Location: PACU  Anesthesia Type: MAC  Level of Consciousness: awake, alert  and patient cooperative  Airway and Oxygen Therapy: Patient Spontanous Breathing and Patient connected to supplemental oxygen  Post-op Assessment: Post-op Vital signs reviewed, Patient's Cardiovascular Status Stable, Respiratory Function Stable, Patent Airway and No signs of Nausea or vomiting  Post-op Vital Signs: Reviewed and stable  Complications: No notable events documented.

## 2024-05-17 NOTE — H&P (Signed)
 [facility]   Primary Care Physician:  Myrla Jon HERO, MD Ophthalmologist: Dr. Curtistine Fava  Pre-Procedure History & Physical: HPI:  Bonnie Duffy is a 68 y.o. female here for cataract surgery.   Past Medical History:  Diagnosis Date   Allergy    Anxiety    Hyperlipidemia    Hypertension    Obesity (BMI 30-39.9)    Pre-diabetes     Past Surgical History:  Procedure Laterality Date   CATARACT EXTRACTION W/PHACO Right 05/10/2024   Procedure: PHACOEMULSIFICATION, CATARACT, WITH IOL INSERTION 23.87 02:05.0;  Surgeon: Fava Curtistine PARAS, MD;  Location: [facility];  Service: Ophthalmology;  Laterality: Right;   EYE SURGERY  2025   TONSILLECTOMY  1960   TUBAL LIGATION      Prior to Admission medications   Medication Sig Start Date End Date Taking? Authorizing Provider  ALPRAZolam  (XANAX ) 0.25 MG tablet Take 0.5 tablets (0.125 mg total) by mouth 2 (two) times daily as needed for anxiety. 07/31/22  Yes Bacigalupo, Angela M, MD  buPROPion  (WELLBUTRIN  XL) 150 MG 24 hr tablet Take 1 tablet (150 mg total) by mouth daily. 02/03/24  Yes Bacigalupo, Jon HERO, MD  busPIRone  (BUSPAR ) 5 MG tablet TAKE 1 TABLET BY MOUTH TWICE A DAY 04/03/24  Yes Bacigalupo, Jon HERO, MD  fenofibrate  (TRICOR ) 145 MG tablet TAKE 1 TABLET (145 MG TOTAL) DAILY BY MOUTH. 01/06/24  Yes Bacigalupo, Jon HERO, MD  fesoterodine  (TOVIAZ ) 8 MG TB24 tablet TAKE 1 TABLET (8 MG TOTAL) DAILY BY MOUTH. 01/06/24  Yes Bacigalupo, Angela M, MD  fexofenadine (ALLEGRA) 180 MG tablet Take by mouth.   Yes [provider]  fluticasone  (FLONASE ) 50 MCG/ACT nasal spray SPRAY 2 SPRAYS INTO EACH NOSTRIL EVERY DAY 04/03/24  Yes Bacigalupo, Angela M, MD  lisinopril -hydrochlorothiazide  (ZESTORETIC ) 20-25 MG tablet TAKE 1 TABLET BY MOUTH EVERY DAY 04/03/24  Yes Bacigalupo, Angela M, MD  magnesium 30 MG tablet Take by mouth 1 day or 1 dose.   Yes [provider]  metoprolol  succinate (TOPROL -XL) 50 MG 24 hr  tablet TAKE 0.5 TABLETS (25 MG TOTAL) BY MOUTH DAILY. TAKE WITH OR IMMEDIATELY FOLLOWING A MEAL. 01/06/24  Yes Bacigalupo, Angela M, MD  Multiple Vitamins-Minerals (MULTIVITAMIN PO) Take by mouth.   Yes [provider]  naltrexone  (DEPADE) 50 MG tablet Take 0.5 tablets (25 mg total) by mouth daily. 11/02/23  Yes Bacigalupo, Angela M, MD  Omega-3 Fatty Acids (FISH OIL) 1200 MG CAPS Take by mouth.   Yes [provider]  rosuvastatin  (CRESTOR ) 10 MG tablet TAKE 1 TABLET BY MOUTH EVERY DAY 01/06/24  Yes Bacigalupo, Angela M, MD  Turmeric (QC TUMERIC COMPLEX PO) Take by mouth daily.   Yes [provider]  vitamin C (ASCORBIC ACID) 500 MG tablet Take 500 mg daily by mouth.   Yes [provider]  Vitamin D , Ergocalciferol , 2000 units CAPS Take by mouth.   Yes [provider]    Allergies as of 04/27/2024 - Review Complete 02/03/2024  Allergen Reaction Noted   Penicillins  04/16/2011   Sulfamethoxazole Rash 12/12/2010    Family History  Problem Relation Age of Onset   Heart disease Mother    Lung cancer Father    Prostate cancer Father    Heart disease Father        chemo-induced   Heart attack Father    Skin cancer Father    Cancer Father    Stroke Sister 14   Miscarriages / Stillbirths Sister  Healthy Brother    Heart disease Maternal Grandmother    Diabetes Maternal Grandmother    Breast cancer Maternal Grandmother 105   Heart disease Maternal Grandfather    Heart disease Paternal Grandmother    Leukemia Paternal Grandfather    Cancer Paternal Grandfather    ADD / ADHD Son    Learning disabilities Son    Anxiety disorder Maternal Uncle    Depression Maternal Uncle    Colon cancer Neg Hx    Ovarian cancer Neg Hx    Cervical cancer Neg Hx     Social History   Socioeconomic History   Marital status: Married    Spouse name: Not on file   Number of children: 2   Years of education: Not on file   Highest education level: Master's  degree (e.g., MA, MS, MEng, MEd, MSW, MBA)  Occupational History    Employer: [employer]    Comment: retired  Tobacco Use   Smoking status: Former    Current packs/day: 0.00    Average packs/day: 1 pack/day for 46.0 years (46.0 ttl pk-yrs)    Types: Cigarettes    Start date: 03/03/1972    Quit date: 03/03/2018    Years since quitting: 6.2   Smokeless tobacco: Never   Tobacco comments:    started smoking at age 84; has quit smoking for more than 10 years altogther in the past  Vaping Use   Vaping status: Never Used  Substance and Sexual Activity   Alcohol use: Yes    Alcohol/week: 1.0 standard drink of alcohol    Types: 1 Glasses of wine per week    Comment: one drink per month; wine   Drug use: No   Sexual activity: Yes    Birth control/protection: Post-menopausal, None  Other Topics Concern   Not on file  Social History Narrative   Not on file   Social Drivers of Health   Financial Resource Strain: Low Risk  (02/01/2024)   Overall Financial Resource Strain (CARDIA)    Difficulty of Paying Living Expenses: Not hard at all  Food Insecurity: No Food Insecurity (02/01/2024)   Hunger Vital Sign    Worried About Running Out of Food in the Last Year: Never true    Ran Out of Food in the Last Year: Never true  Transportation Needs: No Transportation Needs (02/01/2024)   PRAPARE - Administrator, Civil Service (Medical): No    Lack of Transportation (Non-Medical): No  Physical Activity: Sufficiently Active (02/01/2024)   Exercise Vital Sign    Days of Exercise per Week: 3 days    Minutes of Exercise per Session: 60 min  Stress: No Stress Concern Present (02/01/2024)   [employer] of Occupational Health - Occupational Stress Questionnaire    Feeling of Stress: Only a little  Social Connections: Socially Integrated (02/01/2024)   Social Connection and Isolation Panel    Frequency of Communication with Friends and Family: More than three times a week    Frequency of Social  Gatherings with Friends and Family: Twice a week    Attends Religious Services: More than 4 times per year    Active Member of [employer] or Organizations: Yes    Attends Engineer, Structural: More than 4 times per year    Marital Status: Married  Catering Manager Violence: Not At Risk (02/03/2024)   Humiliation, Afraid, Rape, and Kick questionnaire    Fear of Current or Ex-Partner: No    Emotionally Abused: No  Physically Abused: No    Sexually Abused: No    Review of Systems: See HPI, otherwise negative ROS  Physical Exam: BP 130/86   Pulse 75   Temp 97.8 F (36.6 C) (Temporal)   Resp 14   Wt 90.7 kg   SpO2 (!) 75%   BMI 35.43 kg/m  General:   Alert, cooperative in NAD Head:  Normocephalic and atraumatic. Respiratory:  Normal work of breathing. Cardiovascular:  RRR  Impression/Plan: Bonnie Duffy is here for cataract surgery.  Risks, benefits, limitations, and alternatives regarding cataract surgery have been reviewed with the patient.  Questions have been answered.  All parties agreeable.   Curtistine JINNY Fava, MD  05/17/2024, 12:26 PM

## 2024-06-07 DIAGNOSIS — M62830 Muscle spasm of back: Secondary | ICD-10-CM | POA: Diagnosis not present

## 2024-06-07 DIAGNOSIS — M5412 Radiculopathy, cervical region: Secondary | ICD-10-CM | POA: Diagnosis not present

## 2024-06-07 DIAGNOSIS — M9902 Segmental and somatic dysfunction of thoracic region: Secondary | ICD-10-CM | POA: Diagnosis not present

## 2024-06-07 DIAGNOSIS — M9901 Segmental and somatic dysfunction of cervical region: Secondary | ICD-10-CM | POA: Diagnosis not present

## 2024-07-06 ENCOUNTER — Other Ambulatory Visit: Payer: Self-pay | Admitting: Family Medicine

## 2024-07-06 DIAGNOSIS — Z1231 Encounter for screening mammogram for malignant neoplasm of breast: Secondary | ICD-10-CM

## 2024-07-14 ENCOUNTER — Other Ambulatory Visit: Payer: Self-pay | Admitting: Family Medicine

## 2024-07-14 DIAGNOSIS — E782 Mixed hyperlipidemia: Secondary | ICD-10-CM

## 2024-07-28 ENCOUNTER — Other Ambulatory Visit: Payer: Self-pay | Admitting: Family Medicine

## 2024-08-22 ENCOUNTER — Encounter
